# Patient Record
Sex: Female | Born: 1950
Health system: Southern US, Community
[De-identification: ages and names within clinical notes are randomized; demographics above are authoritative.]

## PROBLEM LIST (undated history)

## (undated) DIAGNOSIS — R112 Nausea with vomiting, unspecified: Secondary | ICD-10-CM

## (undated) DIAGNOSIS — T8859XA Other complications of anesthesia, initial encounter: Secondary | ICD-10-CM

## (undated) DIAGNOSIS — Z9889 Other specified postprocedural states: Secondary | ICD-10-CM

## (undated) DIAGNOSIS — E119 Type 2 diabetes mellitus without complications: Secondary | ICD-10-CM

## (undated) DIAGNOSIS — T7840XA Allergy, unspecified, initial encounter: Secondary | ICD-10-CM

## (undated) DIAGNOSIS — J302 Other seasonal allergic rhinitis: Secondary | ICD-10-CM

## (undated) DIAGNOSIS — N6320 Unspecified lump in the left breast, unspecified quadrant: Secondary | ICD-10-CM

## (undated) DIAGNOSIS — T4145XA Adverse effect of unspecified anesthetic, initial encounter: Secondary | ICD-10-CM

## (undated) HISTORY — DX: Allergy, unspecified, initial encounter: T78.40XA

## (undated) HISTORY — PX: BREAST SURGERY: SHX581

## (undated) HISTORY — PX: CHOLECYSTECTOMY: SHX55

## (undated) HISTORY — PX: KNEE ARTHROSCOPY: SUR90

## (undated) HISTORY — PX: TONSILLECTOMY: SUR1361

---

## 1999-11-15 ENCOUNTER — Encounter (INDEPENDENT_AMBULATORY_CARE_PROVIDER_SITE_OTHER): Payer: Self-pay | Admitting: Specialist

## 1999-11-15 ENCOUNTER — Ambulatory Visit (HOSPITAL_BASED_OUTPATIENT_CLINIC_OR_DEPARTMENT_OTHER): Admission: RE | Admit: 1999-11-15 | Discharge: 1999-11-15 | Payer: Self-pay | Admitting: *Deleted

## 2002-04-21 ENCOUNTER — Emergency Department (HOSPITAL_COMMUNITY): Admission: EM | Admit: 2002-04-21 | Discharge: 2002-04-21 | Payer: Self-pay

## 2008-09-22 ENCOUNTER — Emergency Department (HOSPITAL_COMMUNITY): Admission: EM | Admit: 2008-09-22 | Discharge: 2008-09-22 | Payer: Self-pay | Admitting: Emergency Medicine

## 2010-10-01 IMAGING — CR DG CERVICAL SPINE COMPLETE 4+V
6 series · 6 of 6 positions shown · non-contrast
Comparison: No priors

CLINICAL DATA: Fell/stiff neck

CERVICAL SPINE - COMPLETE 4+ VIEW

[w c-spine lat]
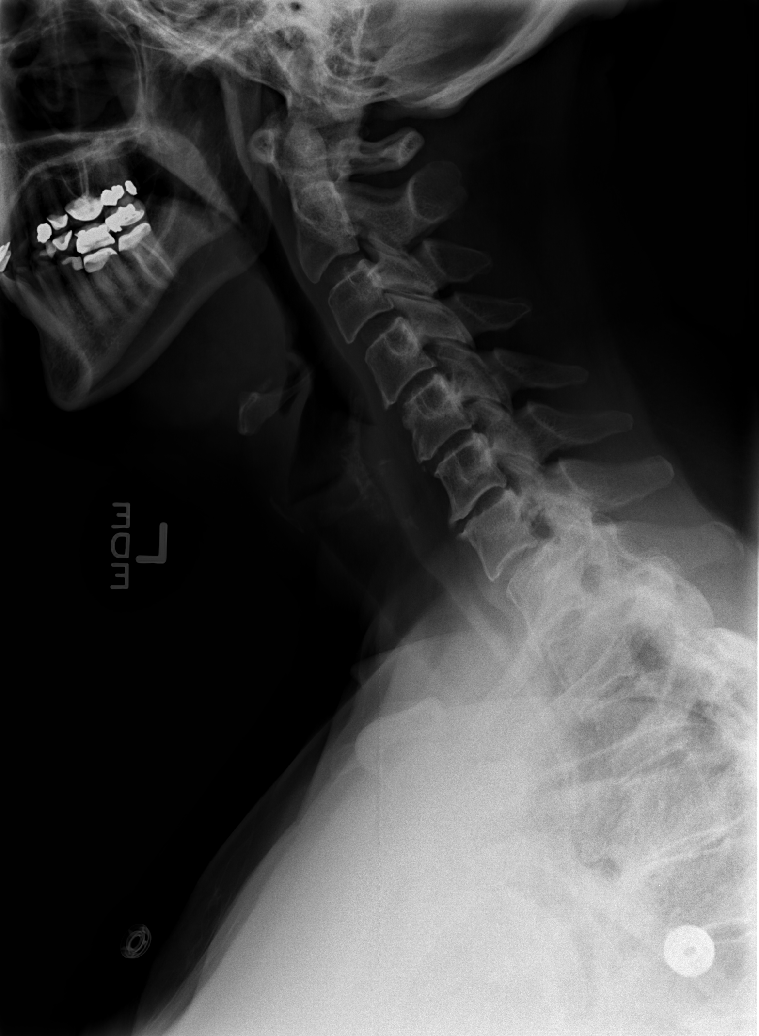

[w c-spine oblique (1 of 2)]
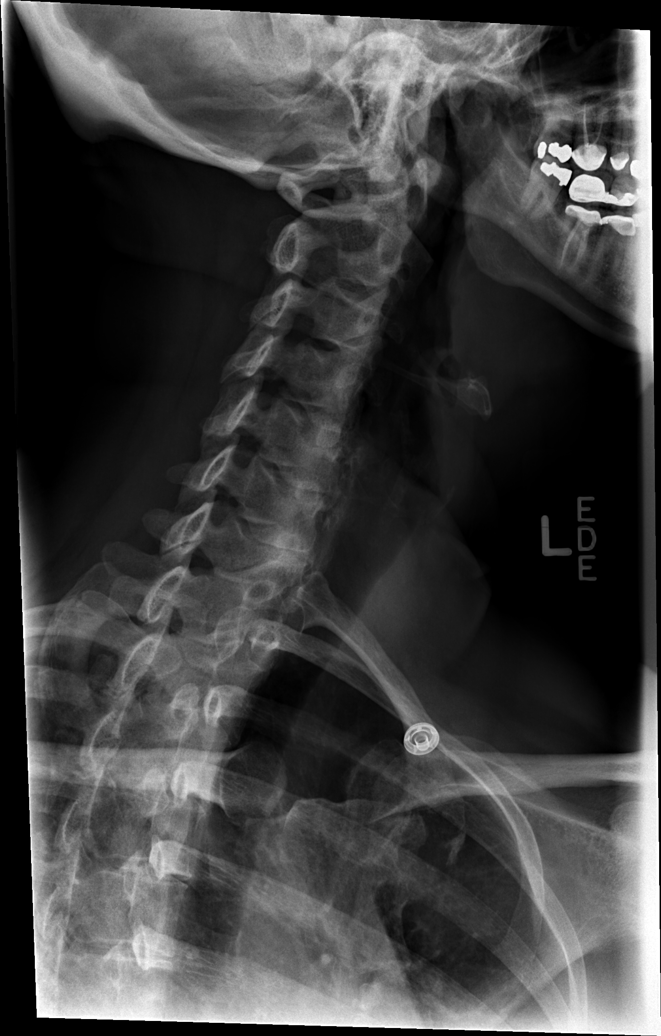

[w c-spine oblique (2 of 2)]
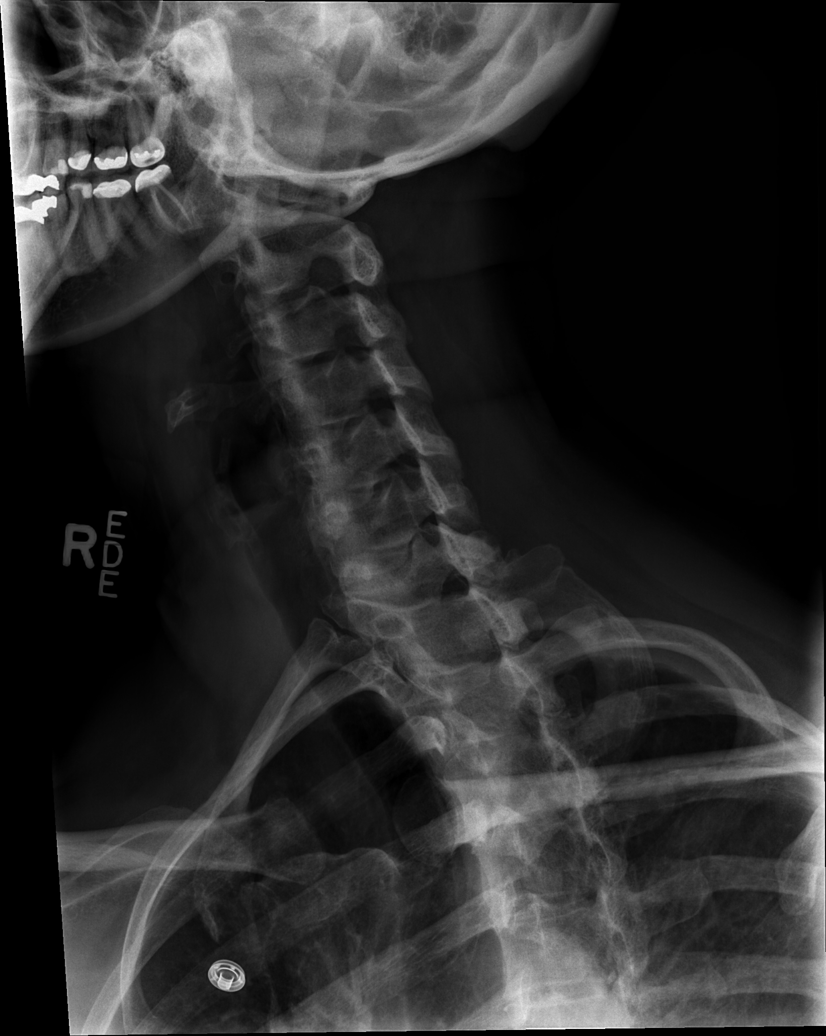

[w c-spine a.p.]
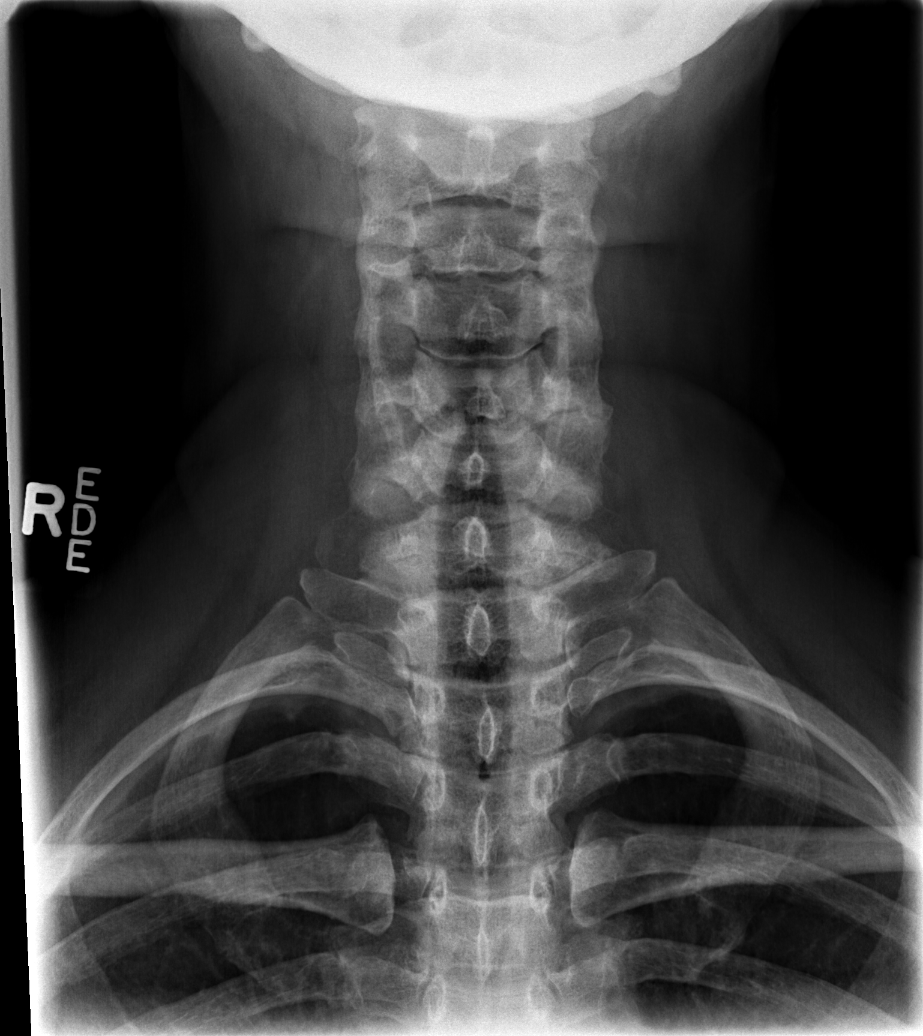

[w c-spine odontoid (1 of 2)]
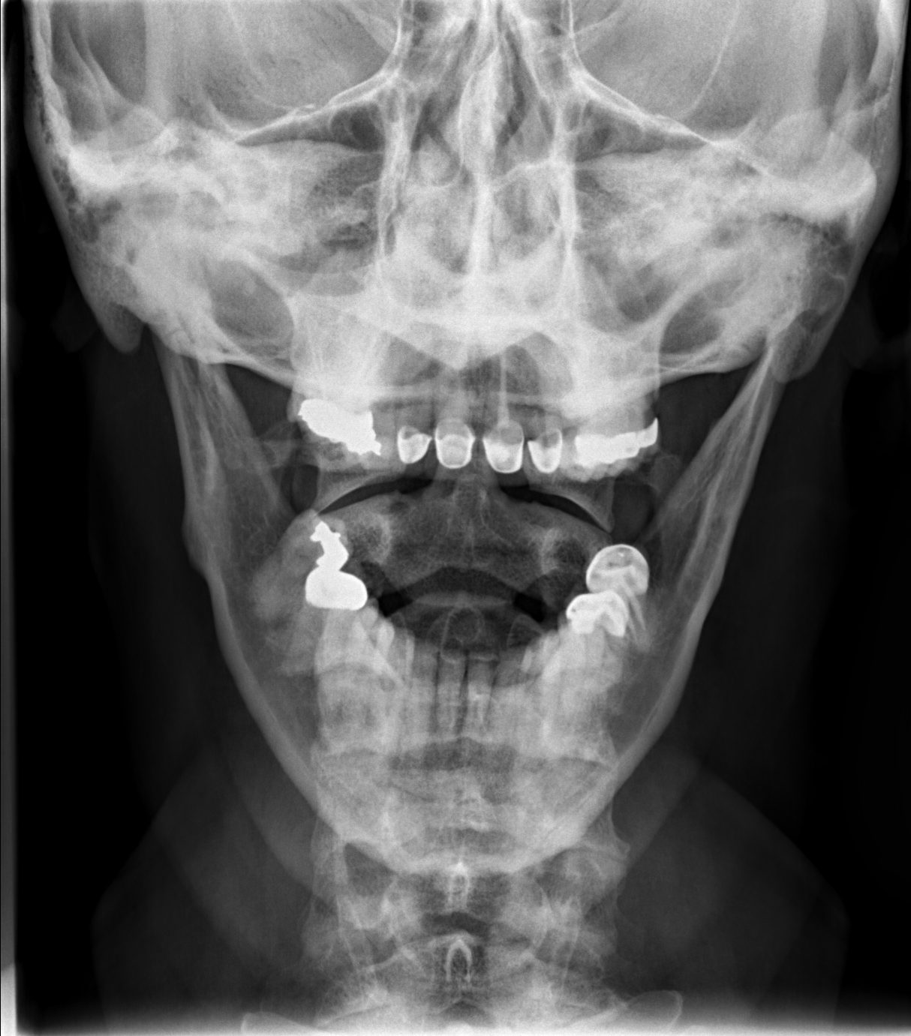

[w c-spine odontoid (2 of 2)]
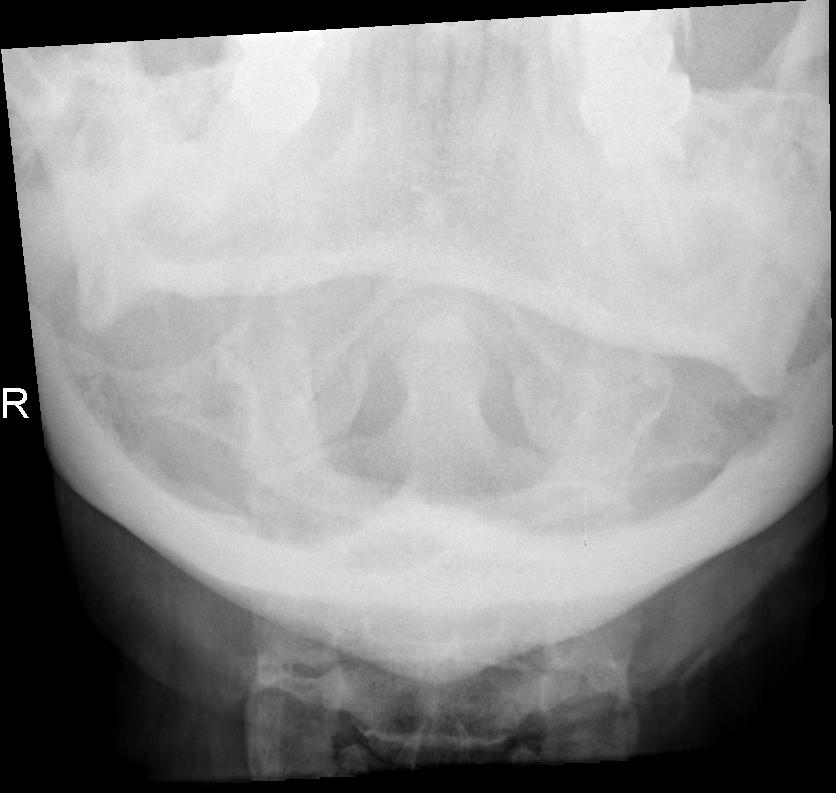

[6 of 6 positions shown; findings below may reference images not displayed]

FINDINGS: The there is loss of lordosis and degenerative disc
disease most notably at C6-7.  No fracture or subluxation.  No
foraminal narrowing.  Prevertebral soft tissues normal.
IMPRESSION: Loss of lordosis and degenerative disc disease - no acute findings.

## 2010-12-31 NOTE — Op Note (Signed)
[place]. [facility]  Patient:    Shawna Payne, Shawna Payne                       MRN: [id number] Proc. Date: 11/15/99 Adm. Date:  [id number] Attending:  Kandis Mannan CC:         Katherine Roan, M.D.             Jonelle Sports. Cheryll Cockayne, M.D.                           Operative Report  CCS NUMBER:  [id number]  PREOPERATIVE DIAGNOSIS:  Bloody nipple discharge, left breast.  POSTOPERATIVE DIAGNOSIS:  Bloody nipple discharge, left breast.  OPERATION:  Excision of ductal tissue, left breast.  SURGEON:  Maisie Fus B. Samuella Cota, M.D.  ANESTHESIA:  General.  ANESTHESIOLOGIST:  Halford Decamp, M.D. and CRNA.  DESCRIPTION OF PROCEDURE:  The patient was taken to the operating room and placed on the table in the supine position.  After satisfactory general anesthetic with LMA intubation, the left breast was prepped and draped as a sterile field.  A circumareolar skin incision was outlined with a skin marker extending from about 7 oclock to 11 oclock.  The circumareolar incision was then made and the areola was dissected free from the underlying tissue.  The patient had a large duct which as identified at about the 8-9 oclock position.  A small lacrimal probe was placed  into this, and it went for about 1 cm.  There was a firm mass effect in the area. The breast tissue in this area was then excised using mainly the cautery. After the specimen had been removed, there seemed to be a firm area at about the 7 oclock position, which was in a linear fashion.  This area was dissected, and it was noted that this was a large fat lobule in the breast.  Bleeding was controlled with the cautery.  The opening of the duct beneath the areola was marked with a  short suture of 4-0 Vicryl.  After hemostasis had been obtained, the wound was irrigated.  The subcuticular skin was closed with interrupted sutures of 4-0 Vicryl.  The circumareolar incision was then closed with a running  subcuticular  suture of 5-0 Vicryl.  Benzoin and 1/2 inch Steri-Strips were used to reinforce the skin closure.  A pressure dressing using 4 x 4, ABD and 4 inch Hypafix was applied. The patient seemed to tolerate the procedure well and was taken to the PACU in satisfactory condition. DD:  11/15/99 TD:  11/15/99 Job: [id number]

## 2012-01-26 ENCOUNTER — Other Ambulatory Visit: Payer: Self-pay | Admitting: Internal Medicine

## 2012-01-26 DIAGNOSIS — R7989 Other specified abnormal findings of blood chemistry: Secondary | ICD-10-CM

## 2012-01-30 ENCOUNTER — Ambulatory Visit
Admission: RE | Admit: 2012-01-30 | Discharge: 2012-01-30 | Disposition: A | Discharge status: Home / Self Care | Payer: BC Managed Care – PPO | Source: Ambulatory Visit | Attending: Internal Medicine | Admitting: Internal Medicine

## 2012-01-30 ENCOUNTER — Other Ambulatory Visit: Payer: Self-pay | Admitting: Internal Medicine

## 2012-01-30 DIAGNOSIS — R7989 Other specified abnormal findings of blood chemistry: Secondary | ICD-10-CM

## 2014-02-07 IMAGING — US US ABDOMEN LIMITED
1 series · 14 of 25 positions shown · non-contrast
Comparison: None.

CLINICAL DATA: Elevated LFTs

ABDOMEN ULTRASOUND LIMITED
TECHNIQUE: Right upper quadrant ultrasound

[Series 1: us abdomen limited · 0.22mm/px · 14 of 36 slices shown]
[im 1/36]
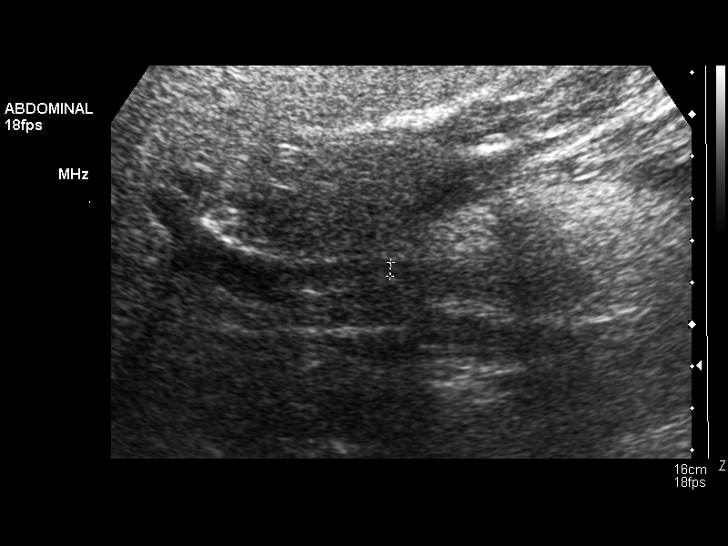
[im 3/36]
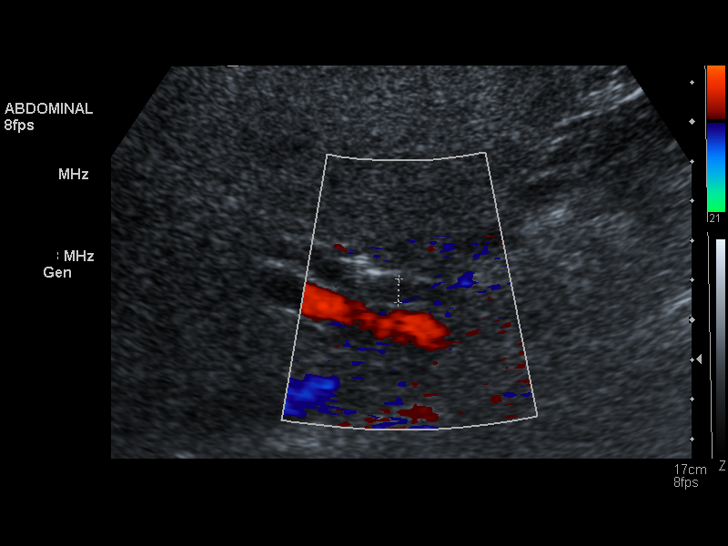
[im 6/36]
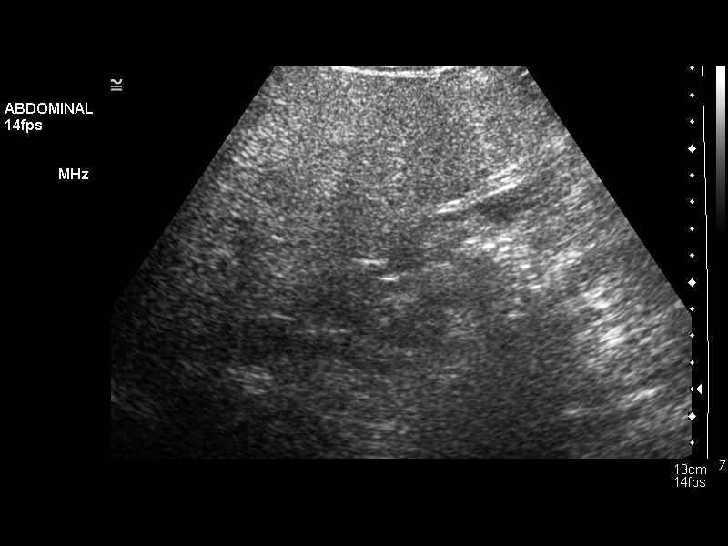
[im 9/36]
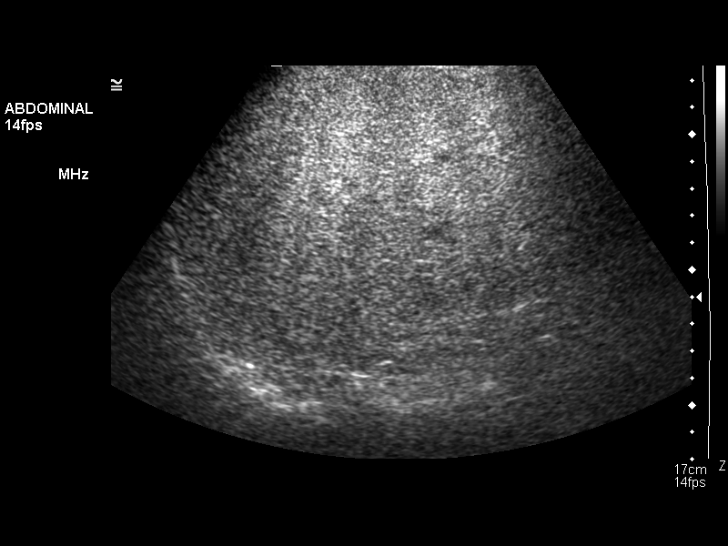
[im 12/36]
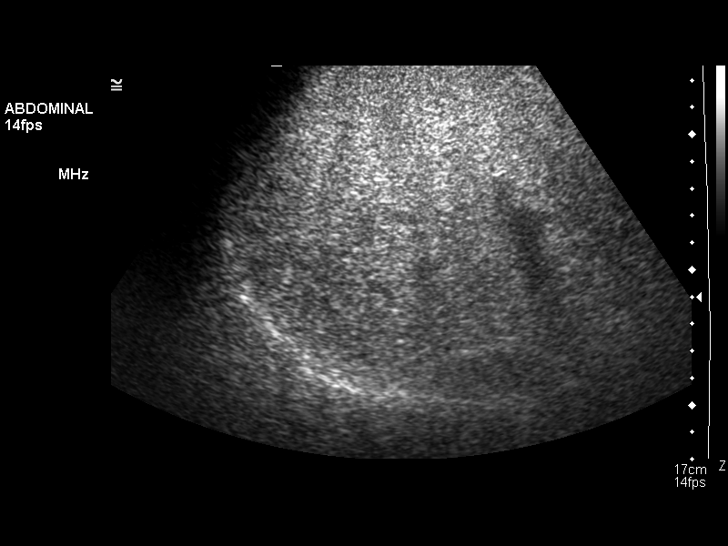
[im 14/36]
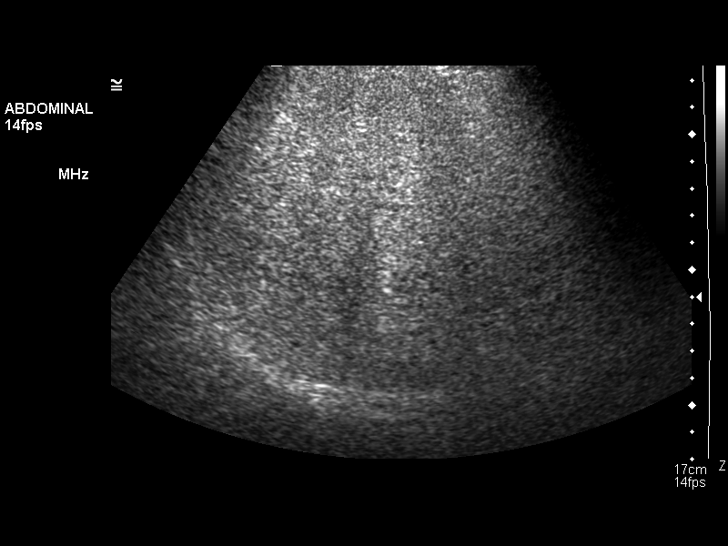
[im 17/36]
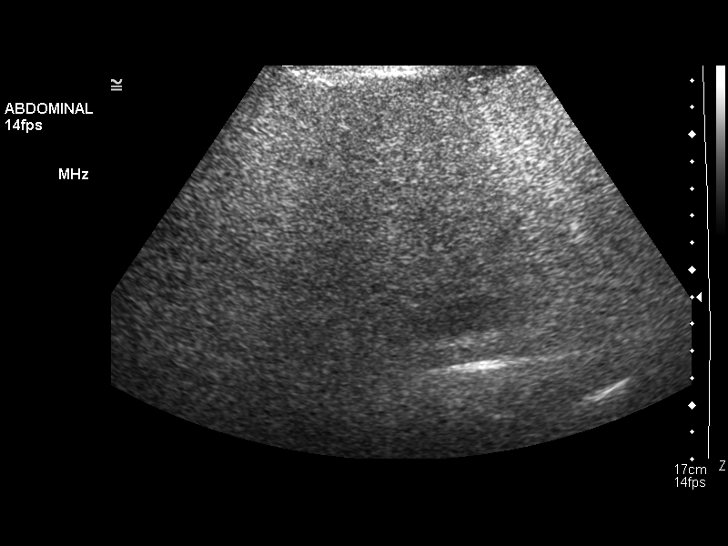
[im 19/36]
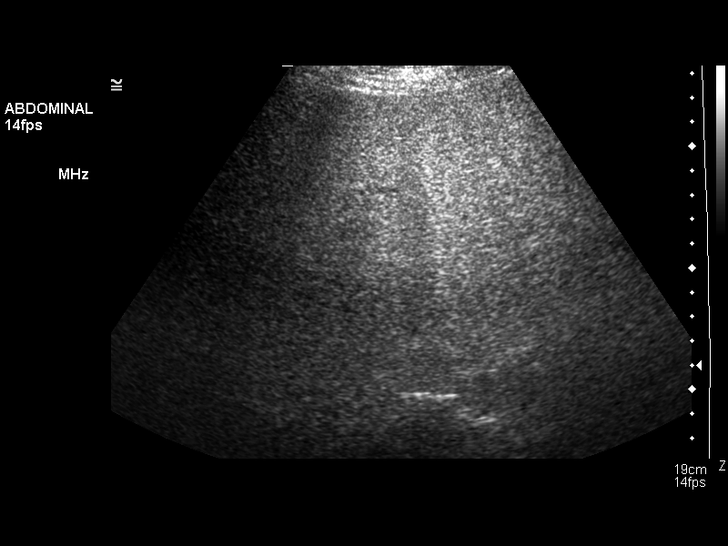
[im 22/36]
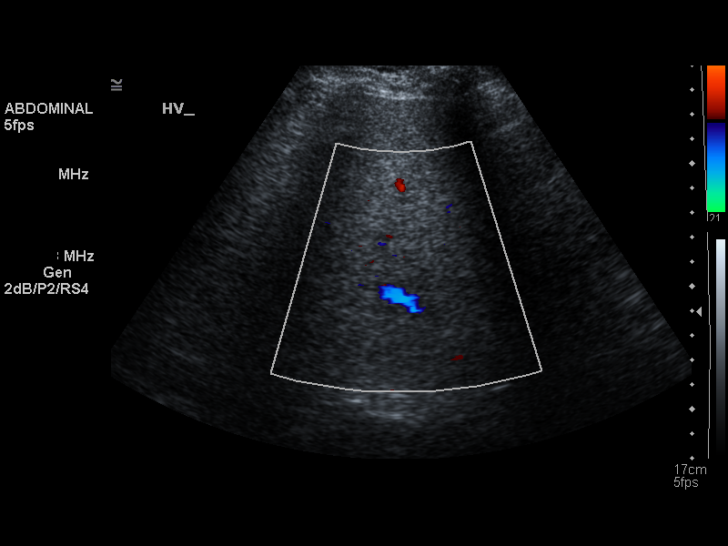
[im 24/36]
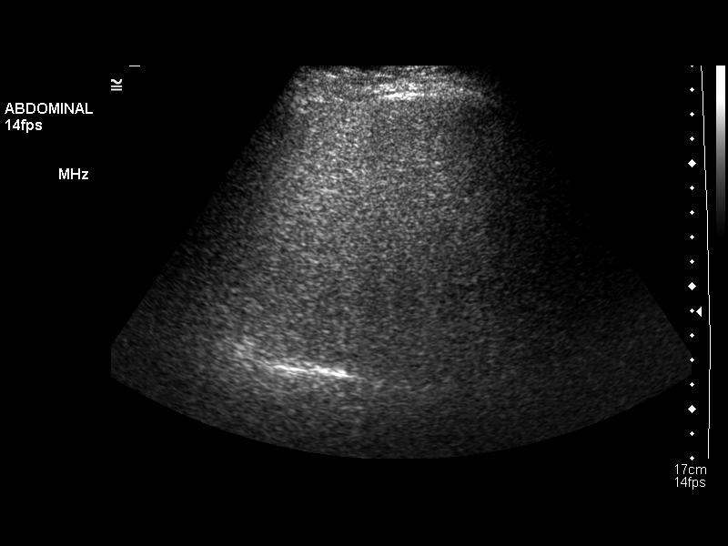
[im 27/36]
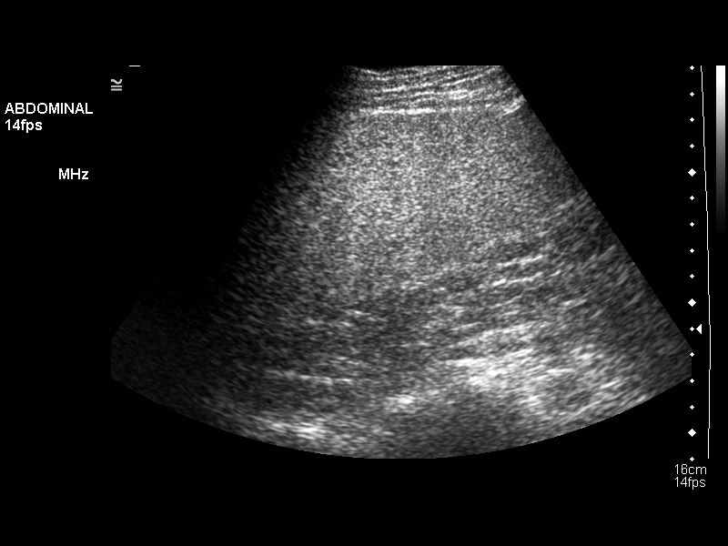
[im 30/36]
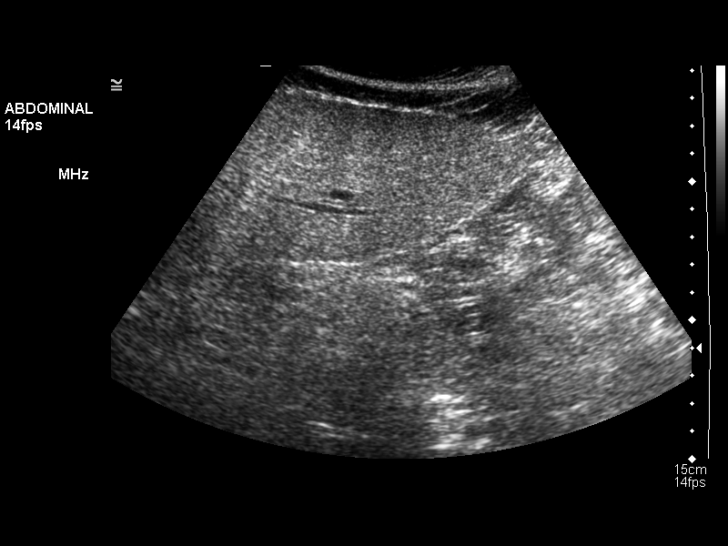
[im 33/36]
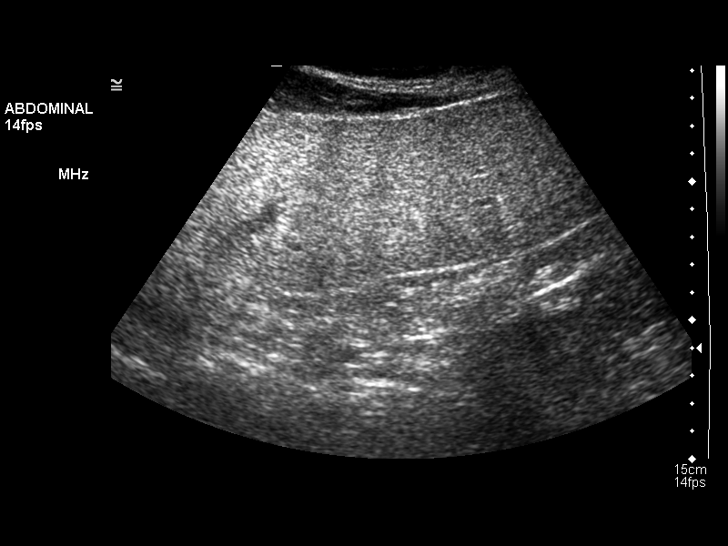
[im 36/36]
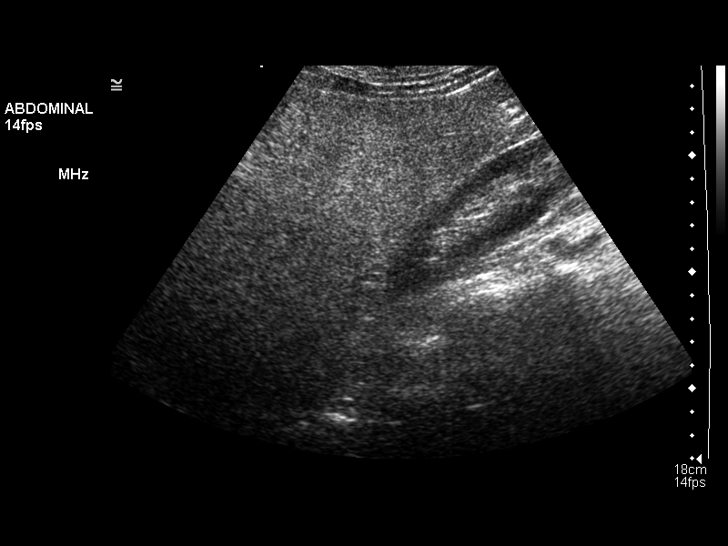

[14 of 25 positions shown; findings below may reference images not displayed]

FINDINGS: The gallbladder is surgically absent.  CBD measures
mm proximally.  Distal CBD measures 3.3 mm in diameter.

Liver shows diffuse increased echogenicity suspicious for fatty
infiltration.  No focal hepatic mass.  No intrahepatic biliary
ductal dilatation.
IMPRESSION: 1.  Surgically absent gallbladder.  CBD measures 6.6 mm proximally.
Distal CBD measures 3.3 mm in diameter.
2.  Diffuse increased echogenicity of the liver suspicious for
fatty infiltration.  No intrahepatic biliary ductal dilatation.  No
focal hepatic mass.

## 2016-05-04 ENCOUNTER — Other Ambulatory Visit: Payer: Self-pay | Admitting: Radiology

## 2016-05-27 ENCOUNTER — Ambulatory Visit: Payer: Self-pay | Admitting: General Surgery

## 2016-05-27 DIAGNOSIS — N6022 Fibroadenosis of left breast: Secondary | ICD-10-CM

## 2016-06-17 ENCOUNTER — Encounter (HOSPITAL_BASED_OUTPATIENT_CLINIC_OR_DEPARTMENT_OTHER): Payer: Self-pay | Admitting: *Deleted

## 2016-06-23 ENCOUNTER — Encounter (HOSPITAL_BASED_OUTPATIENT_CLINIC_OR_DEPARTMENT_OTHER)
Admission: RE | Admit: 2016-06-23 | Discharge: 2016-06-23 | Disposition: A | Discharge status: Home / Self Care | Payer: BLUE CROSS/BLUE SHIELD | Source: Ambulatory Visit | Attending: General Surgery | Admitting: General Surgery

## 2016-06-23 DIAGNOSIS — Z01812 Encounter for preprocedural laboratory examination: Secondary | ICD-10-CM | POA: Insufficient documentation

## 2016-06-23 DIAGNOSIS — Z01810 Encounter for preprocedural cardiovascular examination: Secondary | ICD-10-CM | POA: Diagnosis not present

## 2016-06-23 LAB — BASIC METABOLIC PANEL
ANION GAP: 12 (ref 5–15)
BUN: 9 mg/dL (ref 6–20)
CHLORIDE: 100 mmol/L — AB (ref 101–111)
CO2: 27 mmol/L (ref 22–32)
Calcium: 9.6 mg/dL (ref 8.9–10.3)
Creatinine, Ser: 0.8 mg/dL (ref 0.44–1.00)
GFR calc Af Amer: >60 mL/min (ref >60)
GFR calc non Af Amer: >60 mL/min (ref >60)
GLUCOSE: 214 mg/dL — AB (ref 65–99)
POTASSIUM: 3.7 mmol/L (ref 3.5–5.1)
Sodium: 139 mmol/L (ref 135–145)

## 2016-06-23 NOTE — Pre-Procedure Instructions (Signed)
8 oz. water given to pt. with instructions to drink by 0900 DOS

## 2016-06-30 ENCOUNTER — Encounter (HOSPITAL_BASED_OUTPATIENT_CLINIC_OR_DEPARTMENT_OTHER)
Admission: RE | Disposition: A | Discharge status: Home / Self Care | Payer: Self-pay | Source: Ambulatory Visit | Attending: General Surgery

## 2016-06-30 ENCOUNTER — Ambulatory Visit (HOSPITAL_BASED_OUTPATIENT_CLINIC_OR_DEPARTMENT_OTHER): Payer: BLUE CROSS/BLUE SHIELD | Admitting: Anesthesiology

## 2016-06-30 ENCOUNTER — Ambulatory Visit (HOSPITAL_BASED_OUTPATIENT_CLINIC_OR_DEPARTMENT_OTHER)
Admission: RE | Admit: 2016-06-30 | Discharge: 2016-06-30 | Disposition: A | Discharge status: Home / Self Care | Payer: BLUE CROSS/BLUE SHIELD | Source: Ambulatory Visit | Attending: General Surgery | Admitting: General Surgery

## 2016-06-30 ENCOUNTER — Encounter (HOSPITAL_BASED_OUTPATIENT_CLINIC_OR_DEPARTMENT_OTHER): Payer: Self-pay | Admitting: *Deleted

## 2016-06-30 DIAGNOSIS — N6022 Fibroadenosis of left breast: Secondary | ICD-10-CM | POA: Insufficient documentation

## 2016-06-30 DIAGNOSIS — E119 Type 2 diabetes mellitus without complications: Secondary | ICD-10-CM | POA: Insufficient documentation

## 2016-06-30 DIAGNOSIS — Z87891 Personal history of nicotine dependence: Secondary | ICD-10-CM | POA: Insufficient documentation

## 2016-06-30 HISTORY — DX: Unspecified lump in the left breast, unspecified quadrant: N63.20

## 2016-06-30 HISTORY — DX: Other complications of anesthesia, initial encounter: T88.59XA

## 2016-06-30 HISTORY — DX: Nausea with vomiting, unspecified: Z98.890

## 2016-06-30 HISTORY — DX: Adverse effect of unspecified anesthetic, initial encounter: T41.45XA

## 2016-06-30 HISTORY — DX: Other seasonal allergic rhinitis: J30.2

## 2016-06-30 HISTORY — DX: Nausea with vomiting, unspecified: R11.2

## 2016-06-30 HISTORY — PX: BREAST LUMPECTOMY WITH RADIOACTIVE SEED LOCALIZATION: SHX6424

## 2016-06-30 HISTORY — DX: Type 2 diabetes mellitus without complications: E11.9

## 2016-06-30 LAB — GLUCOSE, CAPILLARY: Glucose-Capillary: 140 mg/dL — ABNORMAL HIGH (ref 65–99)

## 2016-06-30 SURGERY — BREAST LUMPECTOMY WITH RADIOACTIVE SEED LOCALIZATION
Anesthesia: General | Site: Breast | Laterality: Left

## 2016-06-30 MED ORDER — MIDAZOLAM HCL 2 MG/2ML IJ SOLN
INTRAMUSCULAR | Status: AC
  Filled 2016-06-30: qty 2

## 2016-06-30 MED ORDER — BUPIVACAINE HCL (PF) 0.25 % IJ SOLN
INTRAMUSCULAR | Status: DC | PRN
  Administered 2016-06-30: 20 mL

## 2016-06-30 MED ORDER — LIDOCAINE 2% (20 MG/ML) 5 ML SYRINGE
INTRAMUSCULAR | Status: DC | PRN
  Administered 2016-06-30: 60 mg via INTRAVENOUS

## 2016-06-30 MED ORDER — CHLORHEXIDINE GLUCONATE CLOTH 2 % EX PADS: 6.0000 | MEDICATED_PAD | Freq: Once | CUTANEOUS | Status: DC

## 2016-06-30 MED ORDER — CEFAZOLIN SODIUM-DEXTROSE 2-4 GM/100ML-% IV SOLN
INTRAVENOUS | Status: AC
  Filled 2016-06-30: qty 100

## 2016-06-30 MED ORDER — FENTANYL CITRATE (PF) 100 MCG/2ML IJ SOLN
INTRAMUSCULAR | Status: AC
  Filled 2016-06-30: qty 2

## 2016-06-30 MED ORDER — PHENYLEPHRINE 40 MCG/ML (10ML) SYRINGE FOR IV PUSH (FOR BLOOD PRESSURE SUPPORT)
PREFILLED_SYRINGE | INTRAVENOUS | Status: DC | PRN
Start: 2016-06-30 — End: 2016-06-30
  Administered 2016-06-30 (×2): 80 ug via INTRAVENOUS

## 2016-06-30 MED ORDER — OXYCODONE HCL 5 MG/5ML PO SOLN: 5.0000 mg | Freq: Once | ORAL | Status: DC | PRN

## 2016-06-30 MED ORDER — MIDAZOLAM HCL 2 MG/2ML IJ SOLN
1.0000 mg | INTRAMUSCULAR | Status: DC | PRN
  Administered 2016-06-30: 2 mg via INTRAVENOUS

## 2016-06-30 MED ORDER — MEPERIDINE HCL 25 MG/ML IJ SOLN: 6.2500 mg | INTRAMUSCULAR | Status: DC | PRN

## 2016-06-30 MED ORDER — DEXAMETHASONE SODIUM PHOSPHATE 10 MG/ML IJ SOLN
INTRAMUSCULAR | Status: AC
  Filled 2016-06-30: qty 1

## 2016-06-30 MED ORDER — OXYCODONE HCL 5 MG PO TABS: 5.0000 mg | ORAL_TABLET | Freq: Once | ORAL | Status: DC | PRN

## 2016-06-30 MED ORDER — FENTANYL CITRATE (PF) 100 MCG/2ML IJ SOLN
50.0000 ug | INTRAMUSCULAR | Status: DC | PRN
  Administered 2016-06-30: 100 ug via INTRAVENOUS

## 2016-06-30 MED ORDER — ONDANSETRON HCL 4 MG/2ML IJ SOLN
INTRAMUSCULAR | Status: AC
  Filled 2016-06-30: qty 2

## 2016-06-30 MED ORDER — SCOPOLAMINE 1 MG/3DAYS TD PT72
MEDICATED_PATCH | TRANSDERMAL | Status: AC
  Filled 2016-06-30: qty 1

## 2016-06-30 MED ORDER — PROPOFOL 10 MG/ML IV BOLUS
INTRAVENOUS | Status: DC | PRN
  Administered 2016-06-30: 50 mg via INTRAVENOUS
  Administered 2016-06-30: 150 mg via INTRAVENOUS

## 2016-06-30 MED ORDER — ONDANSETRON HCL 4 MG/2ML IJ SOLN
INTRAMUSCULAR | Status: DC | PRN
  Administered 2016-06-30: 4 mg via INTRAVENOUS

## 2016-06-30 MED ORDER — LIDOCAINE 2% (20 MG/ML) 5 ML SYRINGE
INTRAMUSCULAR | Status: AC
  Filled 2016-06-30: qty 5

## 2016-06-30 MED ORDER — PROMETHAZINE HCL 25 MG/ML IJ SOLN: 6.2500 mg | INTRAMUSCULAR | Status: DC | PRN

## 2016-06-30 MED ORDER — LACTATED RINGERS IV SOLN: INTRAVENOUS | Status: DC

## 2016-06-30 MED ORDER — LACTATED RINGERS IV SOLN
INTRAVENOUS | Status: DC
  Administered 2016-06-30 (×3): via INTRAVENOUS

## 2016-06-30 MED ORDER — PROPOFOL 10 MG/ML IV BOLUS
INTRAVENOUS | Status: AC
  Filled 2016-06-30: qty 20

## 2016-06-30 MED ORDER — SCOPOLAMINE 1 MG/3DAYS TD PT72
1.0000 | MEDICATED_PATCH | Freq: Once | TRANSDERMAL | Status: AC | PRN
  Administered 2016-06-30: 1 via TRANSDERMAL

## 2016-06-30 MED ORDER — HYDROCODONE-ACETAMINOPHEN 5-325 MG PO TABS: 1.0000 | ORAL_TABLET | ORAL | 0 refills | Status: DC | PRN

## 2016-06-30 MED ORDER — HYDROMORPHONE HCL 1 MG/ML IJ SOLN: 0.2500 mg | INTRAMUSCULAR | Status: DC | PRN

## 2016-06-30 MED ORDER — CEFAZOLIN SODIUM-DEXTROSE 2-4 GM/100ML-% IV SOLN
2.0000 g | INTRAVENOUS | Status: AC
  Administered 2016-06-30: 2 g via INTRAVENOUS

## 2016-06-30 MED ORDER — DEXAMETHASONE SODIUM PHOSPHATE 4 MG/ML IJ SOLN
INTRAMUSCULAR | Status: DC | PRN
  Administered 2016-06-30: 10 mg via INTRAVENOUS

## 2016-06-30 SURGICAL SUPPLY — 45 items
ADH SKN CLS APL DERMABOND .7 (GAUZE/BANDAGES/DRESSINGS) ×1
APPLIER CLIP 9.375 MED OPEN (MISCELLANEOUS) ×2
APR CLP MED 9.3 20 MLT OPN (MISCELLANEOUS) ×1
BLADE SURG 15 STRL LF DISP TIS (BLADE) ×1 IMPLANT
BLADE SURG 15 STRL SS (BLADE) ×2
CANISTER SUC SOCK COL 7IN (MISCELLANEOUS) ×1 IMPLANT
CANISTER SUCT 1200ML W/VALVE (MISCELLANEOUS) ×1 IMPLANT
CHLORAPREP W/TINT 26ML (MISCELLANEOUS) ×2 IMPLANT
CLIP APPLIE 9.375 MED OPEN (MISCELLANEOUS) IMPLANT
COVER BACK TABLE 60X90IN (DRAPES) ×2 IMPLANT
COVER MAYO STAND STRL (DRAPES) ×2 IMPLANT
COVER PROBE W GEL 5X96 (DRAPES) ×2 IMPLANT
DECANTER SPIKE VIAL GLASS SM (MISCELLANEOUS) IMPLANT
DERMABOND ADVANCED (GAUZE/BANDAGES/DRESSINGS) ×1
DERMABOND ADVANCED .7 DNX12 (GAUZE/BANDAGES/DRESSINGS) ×1 IMPLANT
DEVICE DUBIN W/COMP PLATE 8390 (MISCELLANEOUS) ×2 IMPLANT
DRAPE LAPAROSCOPIC ABDOMINAL (DRAPES) ×1 IMPLANT
DRAPE UTILITY XL STRL (DRAPES) ×2 IMPLANT
ELECT COATED BLADE 2.86 ST (ELECTRODE) ×2 IMPLANT
ELECT REM PT RETURN 9FT ADLT (ELECTROSURGICAL) ×2
ELECTRODE REM PT RTRN 9FT ADLT (ELECTROSURGICAL) ×1 IMPLANT
GLOVE BIO SURGEON STRL SZ 6.5 (GLOVE) ×2 IMPLANT
GLOVE BIO SURGEON STRL SZ7.5 (GLOVE) ×4 IMPLANT
GLOVE BIOGEL PI IND STRL 7.0 (GLOVE) IMPLANT
GLOVE BIOGEL PI INDICATOR 7.0 (GLOVE) ×2
GOWN STRL REUS W/ TWL LRG LVL3 (GOWN DISPOSABLE) ×2 IMPLANT
GOWN STRL REUS W/TWL LRG LVL3 (GOWN DISPOSABLE) ×6
ILLUMINATOR WAVEGUIDE N/F (MISCELLANEOUS) IMPLANT
KIT MARKER MARGIN INK (KITS) ×2 IMPLANT
LIGHT WAVEGUIDE WIDE FLAT (MISCELLANEOUS) IMPLANT
NDL HYPO 25X1 1.5 SAFETY (NEEDLE) IMPLANT
NEEDLE HYPO 25X1 1.5 SAFETY (NEEDLE) ×2 IMPLANT
NS IRRIG 1000ML POUR BTL (IV SOLUTION) ×1 IMPLANT
PACK BASIN DAY SURGERY FS (CUSTOM PROCEDURE TRAY) ×2 IMPLANT
PENCIL BUTTON HOLSTER BLD 10FT (ELECTRODE) ×2 IMPLANT
SLEEVE SCD COMPRESS KNEE MED (MISCELLANEOUS) ×2 IMPLANT
SPONGE LAP 18X18 X RAY DECT (DISPOSABLE) ×2 IMPLANT
SUT MON AB 4-0 PC3 18 (SUTURE) IMPLANT
SUT SILK 2 0 SH (SUTURE) IMPLANT
SUT VICRYL 3-0 CR8 SH (SUTURE) ×2 IMPLANT
SYR CONTROL 10ML LL (SYRINGE) ×1 IMPLANT
TOWEL OR 17X24 6PK STRL BLUE (TOWEL DISPOSABLE) ×2 IMPLANT
TOWEL OR NON WOVEN STRL DISP B (DISPOSABLE) ×1 IMPLANT
TUBE CONNECTING 20X1/4 (TUBING) ×1 IMPLANT
YANKAUER SUCT BULB TIP NO VENT (SUCTIONS) IMPLANT

## 2016-06-30 NOTE — Discharge Instructions (Signed)

## 2016-06-30 NOTE — Anesthesia Postprocedure Evaluation (Signed)
Anesthesia Post Note  Patient: Shawna Payne  Procedure(s) Performed: Procedure(s) (LRB): LEFT BREAST LUMPECTOMY WITH RADIOACTIVE SEED LOCALIZATION (Left)  Patient location during evaluation: PACU Anesthesia Type: General Level of consciousness: awake and alert Pain management: pain level controlled Vital Signs Assessment: post-procedure vital signs reviewed and stable Respiratory status: spontaneous breathing, nonlabored ventilation, respiratory function stable and patient connected to nasal cannula oxygen Cardiovascular status: blood pressure returned to baseline and stable Postop Assessment: no signs of nausea or vomiting Anesthetic complications: no    Last Vitals:  Vitals:   06/30/16 1430 06/30/16 1445  BP: (!) 146/69 (!) 142/65  Pulse: 82 78  Resp: 13 12  Temp:      Last Pain:  Vitals:   06/30/16 1445  TempSrc:   PainSc: 0-No pain                 Effie Berkshire

## 2016-06-30 NOTE — H&P (Signed)
Shawna Payne  Location: [facility] Surgery Patient #: [id number] DOB: 1951-05-13 Married / Language: Undefined / Race: White Female   History of Present Illness  The patient is a 64 year old female who presents with a breast mass. We are asked to see the patient in consultation by Dr. Elige Radon to evaluate her for a complex sclerosing lesion of the left breast. The patient is a 65 year old white female who has been getting 6 month follow-ups for a cyst in the lower portion of the left breast. On her recent mammogram an area of distortion was seen in the lower outer left breast. This was biopsied and came back as a complex sclerosing lesion. She denies any breast pain or discharge from the nipple. She does have a history of a benign papilloma removed from the left breast about 20 years ago.   Other Problems  Cholelithiasis Diabetes Mellitus Heart murmur Migraine Headache  Past Surgical History  Appendectomy Breast Biopsy Left. Gallbladder Surgery - Open Knee Surgery Left. Tonsillectomy  Diagnostic Studies History  Colonoscopy never Mammogram within last year Pap Smear >5 years ago  Allergies  Sulfa Antibiotics PenicillAMINE *MISCELLANEOUS THERAPEUTIC CLASSES* Erythromycin Estolate *CHEMICALS*  Medication History  MetFORMIN HCl (1000MG  Tablet, Oral) Active.  Social History  Caffeine use Tea. No alcohol use No drug use Tobacco use Former smoker.  Family History  Diabetes Mellitus Mother. Heart Disease Mother. Hypertension Mother. Migraine Headache Mother. Thyroid problems Mother.  Pregnancy / Birth History  Age at menarche 53 years. Age of menopause 69-55 Gravida 2 Length (months) of breastfeeding 3-6 Maternal age 2-25 Para 2    Review of Systems  General Not Present- Appetite Loss, Chills, Fatigue, Fever, Night Sweats, Weight Gain and Weight Loss. Skin Not Present- Change in Wart/Mole, Dryness, Hives, Jaundice,  New Lesions, Non-Healing Wounds, Rash and Ulcer. HEENT Present- Seasonal Allergies and Wears glasses/contact lenses. Not Present- Earache, Hearing Loss, Hoarseness, Nose Bleed, Oral Ulcers, Ringing in the Ears, Sinus Pain, Sore Throat, Visual Disturbances and Yellow Eyes. Respiratory Not Present- Bloody sputum, Chronic Cough, Difficulty Breathing, Snoring and Wheezing. Breast Present- Breast Mass. Not Present- Breast Pain, Nipple Discharge and Skin Changes. Cardiovascular Not Present- Chest Pain, Difficulty Breathing Lying Down, Leg Cramps, Palpitations, Rapid Heart Rate, Shortness of Breath and Swelling of Extremities. Gastrointestinal Not Present- Abdominal Pain, Bloating, Bloody Stool, Change in Bowel Habits, Chronic diarrhea, Constipation, Difficulty Swallowing, Excessive gas, Gets full quickly at meals, Hemorrhoids, Indigestion, Nausea, Rectal Pain and Vomiting. Female Genitourinary Not Present- Frequency, Nocturia, Painful Urination, Pelvic Pain and Urgency. Musculoskeletal Not Present- Back Pain, Joint Pain, Joint Stiffness, Muscle Pain, Muscle Weakness and Swelling of Extremities. Neurological Not Present- Decreased Memory, Fainting, Headaches, Numbness, Seizures, Tingling, Tremor, Trouble walking and Weakness. Psychiatric Not Present- Anxiety, Bipolar, Change in Sleep Pattern, Depression, Fearful and Frequent crying. Endocrine Not Present- Cold Intolerance, Excessive Hunger, Hair Changes, Heat Intolerance, Hot flashes and New Diabetes. Hematology Not Present- Blood Thinners, Easy Bruising, Excessive bleeding, Gland problems, HIV and Persistent Infections.  Vitals  Weight: 176 lb Height: 67.5in Body Surface Area: 1.93 m Body Mass Index: 27.16 kg/m  Temp.: 37F(Temporal)  Pulse: 76 (Regular)  BP: 124/80 (Sitting, Left Arm, Standard)       Physical Exam  General Mental Status-Alert. General Appearance-Consistent with stated age. Hydration-Well  hydrated. Voice-Normal.  Head and Neck Head-normocephalic, atraumatic with no lesions or palpable masses. Trachea-midline. Thyroid Gland Characteristics - normal size and consistency.  Eye Eyeball - Bilateral-Extraocular movements intact. Sclera/Conjunctiva - Bilateral-No scleral icterus.  Chest and Lung Exam Chest and lung exam reveals -quiet, even and easy respiratory effort with no use of accessory muscles and on auscultation, normal breath sounds, no adventitious sounds and normal vocal resonance. Inspection Chest Wall - Normal. Back - normal.  Breast Note: There is no palpable mass in either breast. There is no palpable axillary, supraclavicular, or cervical lymphadenopathy.   Cardiovascular Cardiovascular examination reveals -normal heart sounds, regular rate and rhythm with no murmurs and normal pedal pulses bilaterally.  Abdomen Inspection Inspection of the abdomen reveals - No Hernias. Skin - Scar - no surgical scars. Palpation/Percussion Palpation and Percussion of the abdomen reveal - Soft, Non Tender, No Rebound tenderness, No Rigidity (guarding) and No hepatosplenomegaly. Auscultation Auscultation of the abdomen reveals - Bowel sounds normal.  Neurologic Neurologic evaluation reveals -alert and oriented x 3 with no impairment of recent or remote memory. Mental Status-Normal.  Musculoskeletal Normal Exam - Left-Upper Extremity Strength Normal and Lower Extremity Strength Normal. Normal Exam - Right-Upper Extremity Strength Normal and Lower Extremity Strength Normal.  Lymphatic Head & Neck  General Head & Neck Lymphatics: Bilateral - Description - Normal. Axillary  General Axillary Region: Bilateral - Description - Normal. Tenderness - Non Tender. Femoral & Inguinal  Generalized Femoral & Inguinal Lymphatics: Bilateral - Description - Normal. Tenderness - Non Tender.    Assessment & Plan  SCLEROSING ADENOSIS OF BREAST, LEFT  (N60.22) Impression: The patient appears to have a complex sclerosing lesion in the lower outer left breast. Because of its abnormal appearance and because it is considered a high risk lesion I would recommend that this area be removed. She would also like to have this done. I have discussed with her in detail the risks and benefits of the operation to remove this area as well as some of the technical aspects and she understands and wishes to proceed. I will plan for a left breast radioactive seed localized lumpectomy. Current Plans Pt Education - Breast Diseases: discussed with patient and provided information.

## 2016-06-30 NOTE — Anesthesia Preprocedure Evaluation (Signed)
Anesthesia Evaluation  Patient identified by MRN, date of birth, ID band Patient awake    Reviewed: Allergy & Precautions, NPO status , Patient's Chart, lab work & pertinent test results  History of Anesthesia Complications (+) PONV and history of anesthetic complications  Airway Mallampati: III  TM Distance: >3 FB   Mouth opening: Limited Mouth Opening  Dental  (+) Teeth Intact, Dental Advisory Given   Pulmonary former smoker,    breath sounds clear to auscultation       Cardiovascular negative cardio ROS   Rhythm:Regular Rate:Normal     Neuro/Psych negative neurological ROS  negative psych ROS   GI/Hepatic negative GI ROS, Neg liver ROS,   Endo/Other  diabetes, Type 2, Oral Hypoglycemic Agents  Renal/GU negative Renal ROS  negative genitourinary   Musculoskeletal negative musculoskeletal ROS (+)   Abdominal   Peds negative pediatric ROS (+)  Hematology negative hematology ROS (+)   Anesthesia Other Findings   Reproductive/Obstetrics negative OB ROS                             Anesthesia Physical Anesthesia Plan  ASA: II  Anesthesia Plan: General   Post-op Pain Management:    Induction: Intravenous  Airway Management Planned: LMA  Additional Equipment:   Intra-op Plan:   Post-operative Plan: Extubation in OR  Informed Consent: I have reviewed the patients History and Physical, chart, labs and discussed the procedure including the risks, benefits and alternatives for the proposed anesthesia with the patient or authorized representative who has indicated his/her understanding and acceptance.   Dental advisory given  Plan Discussed with: CRNA  Anesthesia Plan Comments:         Anesthesia Quick Evaluation

## 2016-06-30 NOTE — Anesthesia Procedure Notes (Signed)
Procedure Name: LMA Insertion Date/Time: 06/30/2016 12:54 PM Performed by: Lyndee Leo Pre-anesthesia Checklist: Patient identified, Emergency Drugs available, Suction available and Patient being monitored Patient Re-evaluated:Patient Re-evaluated prior to inductionOxygen Delivery Method: Circle system utilized Preoxygenation: Pre-oxygenation with 100% oxygen Intubation Type: IV induction Ventilation: Mask ventilation without difficulty LMA: LMA inserted LMA Size: 3.0 Number of attempts: 1 Airway Equipment and Method: Bite block Placement Confirmation: positive ETCO2 Tube secured with: Tape Dental Injury: Teeth and Oropharynx as per pre-operative assessment

## 2016-06-30 NOTE — Op Note (Signed)
06/30/2016  1:37 PM  PATIENT:  Shawna Payne  65 y.o. female  PRE-OPERATIVE DIAGNOSIS:  LEFT BREAST COMPLEX SCLEROSING LESION  POST-OPERATIVE DIAGNOSIS:  LEFT BREAST COMPLEX SCLEROSING LESION  PROCEDURE:  Procedure(s): LEFT BREAST LUMPECTOMY WITH RADIOACTIVE SEED LOCALIZATION (Left)  SURGEON:  Surgeon(s) and Role:    * Jovita Kussmaul, MD - Primary  PHYSICIAN ASSISTANT:   ASSISTANTS: none   ANESTHESIA:   local and general  EBL:  Total I/O In: 200 [I.V.:200] Out: -   BLOOD ADMINISTERED:none  DRAINS: none   LOCAL MEDICATIONS USED:  MARCAINE     SPECIMEN:  Source of Specimen:  left breast tissue  DISPOSITION OF SPECIMEN:  PATHOLOGY  COUNTS:  YES  TOURNIQUET:  * No tourniquets in log *  DICTATION: .Dragon Dictation   After informed consent was obtained, the patient was brought to the operating room and placed in the supine position on the operating room table. After adequate induction of anesthesia, the patient's left breast was prepped with chloroprep, allowed to dry, and draped in the usual sterile manner. An appropriate timeout was performed. Previously an I125 seed was placed in the lower portion of the left breast to mark an area of a complex sclerosing lesion. The neoprobe was set to I 125 and the area of radioactivity was readily identified. A curvilinear incision was made along the inferior edge of the areola of the left breast. The incision was carried through the skin and subcutaneous tissue sharply with the electrocautery. Once the area of the radioactive seed was approached, then a circular portion of tissue was excised sharply with the electrocautery around the radioactive seed while checking the area of radioactivity frequently. Once the specimen was removed it was oriented with the appropriate paint colors. A specimen radiograph was obtained that showed the clip and seed to be within the specimen. The specimen was then sent to pathology for further evaluation. The  area was then infiltrated with 0.25% marcaine. The deep portion of the wound was then closed with layers of interrupted 3-0 vicryl stitches. The skin was then closed with interrupted 4-0 monocryl subcuticular stitches. Dermabond dressings were applied. The patient tolerated the procedure well. At the end of the case all needle sponge and instrument counts were correct. The patient was then awakened and taken to recovery in stable condition.  PLAN OF CARE: Discharge to home after PACU  PATIENT DISPOSITION:  PACU - hemodynamically stable.   Delay start of Pharmacological VTE agent (>24hrs) due to surgical blood loss or risk of bleeding: not applicable

## 2016-06-30 NOTE — Transfer of Care (Signed)
Immediate Anesthesia Transfer of Care Note  Patient: Shawna Payne  Procedure(s) Performed: Procedure(s): LEFT BREAST LUMPECTOMY WITH RADIOACTIVE SEED LOCALIZATION (Left)  Patient Location: PACU  Anesthesia Type:General  Level of Consciousness: awake, sedated and patient cooperative  Airway & Oxygen Therapy: Patient Spontanous Breathing and Patient connected to face mask oxygen  Post-op Assessment: Report given to RN and Post -op Vital signs reviewed and stable  Post vital signs: Reviewed and stable  Last Vitals:  Vitals:   06/30/16 1130  BP: (!) 167/71  Pulse: 90  Resp: 20  Temp: 36.8 C    Last Pain:  Vitals:   06/30/16 1130  TempSrc: Oral         Complications: No apparent anesthesia complications

## 2016-07-01 ENCOUNTER — Encounter (HOSPITAL_BASED_OUTPATIENT_CLINIC_OR_DEPARTMENT_OTHER): Payer: Self-pay | Admitting: General Surgery

## 2016-07-22 ENCOUNTER — Telehealth: Payer: Self-pay | Admitting: Oncology

## 2016-07-22 NOTE — Telephone Encounter (Signed)
Appt scheduled w/Magrinat on 12/20 at 4pm. Pt aware to arrive 30 minutes early. Demographics verified. Letter mailed.

## 2016-07-25 ENCOUNTER — Encounter: Payer: Self-pay | Admitting: Oncology

## 2016-08-02 ENCOUNTER — Other Ambulatory Visit: Payer: Self-pay | Admitting: *Deleted

## 2016-08-02 DIAGNOSIS — Z1239 Encounter for other screening for malignant neoplasm of breast: Secondary | ICD-10-CM

## 2016-08-03 ENCOUNTER — Other Ambulatory Visit (HOSPITAL_BASED_OUTPATIENT_CLINIC_OR_DEPARTMENT_OTHER): Payer: BLUE CROSS/BLUE SHIELD

## 2016-08-03 ENCOUNTER — Ambulatory Visit (HOSPITAL_BASED_OUTPATIENT_CLINIC_OR_DEPARTMENT_OTHER): Payer: BLUE CROSS/BLUE SHIELD | Admitting: Oncology

## 2016-08-03 DIAGNOSIS — Z801 Family history of malignant neoplasm of trachea, bronchus and lung: Secondary | ICD-10-CM | POA: Diagnosis not present

## 2016-08-03 DIAGNOSIS — Z9189 Other specified personal risk factors, not elsewhere classified: Secondary | ICD-10-CM | POA: Insufficient documentation

## 2016-08-03 DIAGNOSIS — Z1239 Encounter for other screening for malignant neoplasm of breast: Secondary | ICD-10-CM

## 2016-08-03 DIAGNOSIS — Z87891 Personal history of nicotine dependence: Secondary | ICD-10-CM | POA: Diagnosis not present

## 2016-08-03 DIAGNOSIS — N6092 Unspecified benign mammary dysplasia of left breast: Secondary | ICD-10-CM | POA: Diagnosis not present

## 2016-08-03 DIAGNOSIS — N6099 Unspecified benign mammary dysplasia of unspecified breast: Secondary | ICD-10-CM

## 2016-08-03 DIAGNOSIS — N6489 Other specified disorders of breast: Secondary | ICD-10-CM

## 2016-08-03 LAB — COMPREHENSIVE METABOLIC PANEL
ALT: 45 U/L (ref 0–55)
AST: 34 U/L (ref 5–34)
Albumin: 4.1 g/dL (ref 3.5–5.0)
Alkaline Phosphatase: 74 U/L (ref 40–150)
Anion Gap: 12 mEq/L — ABNORMAL HIGH (ref 3–11)
BUN: 9.9 mg/dL (ref 7.0–26.0)
CHLORIDE: 104 meq/L (ref 98–109)
CO2: 23 meq/L (ref 22–29)
Calcium: 9.6 mg/dL (ref 8.4–10.4)
Creatinine: 0.8 mg/dL (ref 0.6–1.1)
EGFR: 75 mL/min/{1.73_m2} — AB (ref >90)
GLUCOSE: 244 mg/dL — AB (ref 70–140)
POTASSIUM: 3.6 meq/L (ref 3.5–5.1)
SODIUM: 138 meq/L (ref 136–145)
Total Bilirubin: 0.54 mg/dL (ref 0.20–1.20)
Total Protein: 7.1 g/dL (ref 6.4–8.3)

## 2016-08-03 LAB — CBC WITH DIFFERENTIAL/PLATELET
BASO%: 0.6 % (ref 0.0–2.0)
BASOS ABS: 0 10*3/uL (ref 0.0–0.1)
EOS ABS: 0.2 10*3/uL (ref 0.0–0.5)
EOS%: 2.3 % (ref 0.0–7.0)
HCT: 42.4 % (ref 34.8–46.6)
HGB: 13.9 g/dL (ref 11.6–15.9)
LYMPH%: 26.6 % (ref 14.0–49.7)
MCH: 27.7 pg (ref 25.1–34.0)
MCHC: 32.8 g/dL (ref 31.5–36.0)
MCV: 84.6 fL (ref 79.5–101.0)
MONO#: 0.4 10*3/uL (ref 0.1–0.9)
MONO%: 6.7 % (ref 0.0–14.0)
NEUT%: 63.8 % (ref 38.4–76.8)
NEUTROS ABS: 4.2 10*3/uL (ref 1.5–6.5)
Platelets: 182 10*3/uL (ref 145–400)
RBC: 5.01 10*6/uL (ref 3.70–5.45)
RDW: 13.8 % (ref 11.2–14.5)
WBC: 6.6 10*3/uL (ref 3.9–10.3)
lymph#: 1.7 10*3/uL (ref 0.9–3.3)

## 2016-08-03 NOTE — Progress Notes (Signed)
[place]  Telephone:([phone]) [phone] Fax:(336) [phone]     ID: MAECEE SCHNELL DOB: [birth year]  MR#: [id number]  [id number]  Patient Care Team: Burnard Bunting, MD as PCP - General (Internal Medicine) Chauncey Cruel, MD OTHER MD:  CHIEF COMPLAINT: Complex sclerosing lesion/ductal hyperplasia without atypia  CURRENT TREATMENT: observation   BREAST CANCER HISTORY: "Shawna Payne" had screening mammography at [facility] generated 20 03/03/2016 showing a new 5 mm oval nodule in the left breast. Breast density was category B. Ultrasound was recommended and confirmed a 7 mm oval lesion in the left breast lower outer quadrant. This was felt to most likely represent a complex cyst and follow-up was recommended.  this was scheduled for 05/03/2016, with diagnostic unilateral left mammography and ultrasonography. The oval mass at the 5:00 position of the left breast was unchanged. However there was now also an oval area of asymmetry more anteriorly and this was biopsied 05/04/2016. The pathology from this procedure (SAA [id number]) showed a complex sclerosing lesion with usual ductal hyperplasia.  Accordingly the patient was referred to surgery and on 06/30/2016 underwent left lumpectomy, with no sentinel lymph node sampling. Final pathology here (SAA [id number]) showed a radial "scar" with usual ductal hyperplasia. There was no atypia or malignancy.  Her subsequent history is as detailed below  INTERVAL HISTORY: Shawna Payne was evaluated in the high-risk clinic 08/03/2016 accompanied by her husband Shawna Payne.   REVIEW OF SYSTEMS: There were no specific symptoms leading to the original mammogram, which was routinely scheduled. The patient denies unusual headaches, visual changes, nausea, vomiting, stiff neck, dizziness, or gait imbalance. There has been no cough, phlegm production, or pleurisy, no chest pain or pressure, and no change in bowel or bladder habits. The patient denies fever, rash, bleeding,  unexplained fatigue or unexplained weight loss. Shawna Payne had knee surgery less than a montago and she is still rehabbing. She usually exercises by walking. A detailed review of systems was otherwise entirely negative.   PAST MEDICAL HISTORY: Past Medical History:  Diagnosis Date  . Breast mass, left   . Complication of anesthesia   . Diabetes mellitus without complication ([place])   . PONV (postoperative nausea and vomiting)   . Seasonal allergies     PAST SURGICAL HISTORY: Past Surgical History:  Procedure Laterality Date  . BREAST LUMPECTOMY WITH RADIOACTIVE SEED LOCALIZATION Left 06/30/2016   Procedure: LEFT BREAST LUMPECTOMY WITH RADIOACTIVE SEED LOCALIZATION;  Surgeon: Autumn Messing III, MD;  Location: [facility];  Service: General;  Laterality: Left;  . BREAST SURGERY Left    papilloma  . CHOLECYSTECTOMY    . KNEE ARTHROSCOPY Left   . TONSILLECTOMY      FAMILY HISTORY No family history on file. The patient's father died from what may have been throat cancer at the age of 83. The patient's mother died from "natural causes" at 27.The patient had one sister, no brothers. The patient's mother was diagnosed with lung cancer at the age of 45, and cured with standard treatment. She was not a smoker. There is no history of breast or ovarian cancer in the family to the patient's knowledge  GYNECOLOGIC HISTORY:  No LMP recorded. Patient is postmenopausal. Menarche age 16, first live birth age 56. The patient is GX P2. She went through the change of life in her mid 32s. She did not use hormone replacement. She used oral contraceptives for less than 2 years remotely with no clotting complications  SOCIAL HISTORY:  Works as a Print production planner. Her husband Shawna Payne  Shawna Payne") is a Orthoptist. Their son Shawna Payne lives in [place] and he teaches multi media at [employer] in the school of journalism. The patient's daughter Shawna Payne lives in [place] and is a  homemaker.     ADVANCED DIRECTIVES: not in place   HEALTH MAINTENANCE: Social History  Substance Use Topics  . Smoking status: Former Research scientist (life sciences)  . Smokeless tobacco: Never Used  . Alcohol use No     Colonoscopy: never  PAP: 2015  Bone density: remote (1995?)   Allergies  Allergen Reactions  . Erythromycin Diarrhea    Severe N/V  . Penicillins Swelling  . Sulfa Antibiotics     Cannot remember reaction     Current Outpatient Prescriptions  Medication Sig Dispense Refill  . HYDROcodone-acetaminophen (NORCO/VICODIN) 5-325 MG tablet Take 1-2 tablets by mouth every 4 (four) hours as needed for moderate pain or severe pain. 20 tablet 0  . metFORMIN (GLUCOPHAGE) 500 MG tablet Take by mouth 2 (two) times daily with a meal.     No current facility-administered medications for this visit.     OBJECTIVE: middle-aged white woman in no acute distress Vitals:   08/03/16 1604  BP: (!) 187/79  Pulse: (!) 101  Resp: 18  Temp: 98.3 F (36.8 C)     Body mass index is 27.5 kg/m.    ECOG FS:1 - Symptomatic but completely ambulatory  Ocular: Sclerae unicteric, pupils equal, round and reactive to light Ear-nose-throat: Oropharynx clear and moist Lymphatic: No cervical or supraclavicular adenopathy Lungs no rales or rhonchi, good excursion bilaterally Heart regular rate and rhythm, no murmur appreciated Abd soft, nontender, positive bowel sounds MSK mild kyphosis but no focal spinal tenderness, no joint edema Neuro: non-focal, well-oriented, appropriate affect Breasts: the right breast is unremarkable. The left breast is status post recent lumpectomy. The cosmetic result is excellent. There is no dehiscence, swelling, or erythema. The left axilla is benign.   LAB RESULTS:  CMP     Component Value Date/Time   NA 139 06/23/2016 1522   K 3.7 06/23/2016 1522   CL 100 (L) 06/23/2016 1522   CO2 27 06/23/2016 1522   GLUCOSE 214 (H) 06/23/2016 1522   BUN 9 06/23/2016 1522    CREATININE 0.80 06/23/2016 1522   CALCIUM 9.6 06/23/2016 1522   GFRNONAA >60 06/23/2016 1522   GFRAA >60 06/23/2016 1522    INo results found for: SPEP, UPEP  Lab Results  Component Value Date   WBC 6.6 08/03/2016   NEUTROABS 4.2 08/03/2016   HGB 13.9 08/03/2016   HCT 42.4 08/03/2016   MCV 84.6 08/03/2016   PLT 182 08/03/2016      Chemistry      Component Value Date/Time   NA 139 06/23/2016 1522   K 3.7 06/23/2016 1522   CL 100 (L) 06/23/2016 1522   CO2 27 06/23/2016 1522   BUN 9 06/23/2016 1522   CREATININE 0.80 06/23/2016 1522      Component Value Date/Time   CALCIUM 9.6 06/23/2016 1522       No results found for: LABCA2  No components found for: [id number]  No results for input(s): INR in the last 168 hours.  Urinalysis No results found for: COLORURINE, APPEARANCEUR, LABSPEC, PHURINE, GLUCOSEU, HGBUR, BILIRUBINUR, KETONESUR, PROTEINUR, UROBILINOGEN, NITRITE, LEUKOCYTESUR   STUDIES: No results found.  ELIGIBLE FOR AVAILABLE RESEARCH PROTOCOL: no  ASSESSMENT: 65 y.o. [place], [place] woman status post left lumpectomy for a radial scar showing usual ductal hyperplasia but no atypia or  malignancy  PLAN: We spent the better part of today's hour-long appointment discussing breast cancer risk in general and Pams specific situation.  We started by clarifying the meaning of her pathology. A radial "scar" is not really a scar, to begin with. It is a sclerosing process which is generally self-limiting and does not necessarily lead to malignancy. However it is frequently associated with malignancy and therefore she needed to have the lumpectomy. Luckily this showed only hyperplasia.  While the word hyperplasia indicates abnormal growth of the ducts or milk tubes in her breast, there was no atypia. This means the cells did not look transformed or on the way to malignancy.  Nevertheless a patient with usual hyperplasia even without atypia will be at higher risk  of developing breast cancer than a patient without this development. The actual risk will be in the range of 1/2% per year. This means Pams risk of developing breast cancer in the next 5 years is approximately 2.5%, and in the next 20 years (which would take her to the average life span of 58) a 10% risk.  There are 2 well documented ways of lowering that risk. One of course would be bilateral mastectomies. This is not recommended for this level of risk, and it would not be covered by insurance. We eliminated that possibility.  A more reasonable choice is to take anti-estrogens. In her situation, raloxifene/Evista might be particularly attractive. It would help with osteopenia problems and it would cut the risk of her developing breast cancer by 50%.  Even though this is a significant relative reduction, it is a very small absolute reduction. It means that if I had 100 women like her and all 100 took raloxifene for 5 years only 5 would benefit. 5 would have breast cancer developing in any case and 90 were not going to have breast cancer develop. Given these numbers Shawna Payne is not motivated to pursue a preventive strategy.  A second way to address her increased breast cancer risk might be to intensify screening. This could be done with yearly MRIs. These are more sensitive, but also much more expensive. The real problem however is false positives. MRIs of the breast are simply too sensitive in this setting and they are much more likely to find abnormalities which are not cancer but which will have to be followed up with biopsy and other procedures which would put her at risk. In addition, her breast density is B, meaning it is very easy to see through her breasts and therefore mammography is sensitive in her situation  In short my recommendation to her as far as screening is concerned is that she continue to have mammography once a year, with tomography (3-D) as well as a yearly or biannual physician breast  exam.  There is some equivocal data that exercise to the tune of 45 minutes 5 times a week in addition to helping her diabetes, bone strength, cardiovascular health, and mood, can decrease the general risk of developing cancer by 2% or so. Certainly once her knee heals it would be in her interest to start a vigorous exercise program.  Incidentally I also strongly urged her to follow Dr. Harley Hallmark recommendation that she obtain a colonoscopy.  Shawna Payne has a good understanding of her situation and of the above discussion. I do not see a need for her to return here but of course I will be glad to see her at any point in the future  Chauncey Cruel, MD   08/03/2016  4:15 PM Medical Oncology and Hematology [facility] [address] [place], [place] [address] Tel. [phone]    Fax. [phone]

## 2016-08-04 ENCOUNTER — Encounter: Payer: Self-pay | Admitting: Oncology

## 2016-08-04 LAB — DRAW EXTRA CLOT TUBE

## 2016-12-16 ENCOUNTER — Encounter: Payer: Self-pay | Admitting: Internal Medicine

## 2017-09-03 DIAGNOSIS — J069 Acute upper respiratory infection, unspecified: Secondary | ICD-10-CM | POA: Diagnosis not present

## 2017-09-03 DIAGNOSIS — J020 Streptococcal pharyngitis: Secondary | ICD-10-CM | POA: Diagnosis not present

## 2017-09-03 DIAGNOSIS — R509 Fever, unspecified: Secondary | ICD-10-CM | POA: Diagnosis not present

## 2017-09-03 DIAGNOSIS — J029 Acute pharyngitis, unspecified: Secondary | ICD-10-CM | POA: Diagnosis not present

## 2017-09-05 DIAGNOSIS — Z6826 Body mass index (BMI) 26.0-26.9, adult: Secondary | ICD-10-CM | POA: Diagnosis not present

## 2017-09-05 DIAGNOSIS — J029 Acute pharyngitis, unspecified: Secondary | ICD-10-CM | POA: Diagnosis not present

## 2017-09-05 DIAGNOSIS — R05 Cough: Secondary | ICD-10-CM | POA: Diagnosis not present

## 2017-09-05 DIAGNOSIS — J069 Acute upper respiratory infection, unspecified: Secondary | ICD-10-CM | POA: Diagnosis not present

## 2017-09-06 DIAGNOSIS — E7849 Other hyperlipidemia: Secondary | ICD-10-CM | POA: Diagnosis not present

## 2017-09-06 DIAGNOSIS — Z6826 Body mass index (BMI) 26.0-26.9, adult: Secondary | ICD-10-CM | POA: Diagnosis not present

## 2017-09-06 DIAGNOSIS — E119 Type 2 diabetes mellitus without complications: Secondary | ICD-10-CM | POA: Diagnosis not present

## 2017-09-06 DIAGNOSIS — Z1389 Encounter for screening for other disorder: Secondary | ICD-10-CM | POA: Diagnosis not present

## 2017-09-06 DIAGNOSIS — J029 Acute pharyngitis, unspecified: Secondary | ICD-10-CM | POA: Diagnosis not present

## 2017-09-06 DIAGNOSIS — M1990 Unspecified osteoarthritis, unspecified site: Secondary | ICD-10-CM | POA: Diagnosis not present

## 2017-09-06 DIAGNOSIS — F329 Major depressive disorder, single episode, unspecified: Secondary | ICD-10-CM | POA: Diagnosis not present

## 2017-11-15 DIAGNOSIS — R928 Other abnormal and inconclusive findings on diagnostic imaging of breast: Secondary | ICD-10-CM | POA: Diagnosis not present

## 2017-12-22 DIAGNOSIS — E7849 Other hyperlipidemia: Secondary | ICD-10-CM | POA: Diagnosis not present

## 2017-12-22 DIAGNOSIS — R82998 Other abnormal findings in urine: Secondary | ICD-10-CM | POA: Diagnosis not present

## 2017-12-22 DIAGNOSIS — E119 Type 2 diabetes mellitus without complications: Secondary | ICD-10-CM | POA: Diagnosis not present

## 2017-12-28 DIAGNOSIS — E7849 Other hyperlipidemia: Secondary | ICD-10-CM | POA: Diagnosis not present

## 2017-12-28 DIAGNOSIS — E1169 Type 2 diabetes mellitus with other specified complication: Secondary | ICD-10-CM | POA: Diagnosis not present

## 2017-12-28 DIAGNOSIS — G43909 Migraine, unspecified, not intractable, without status migrainosus: Secondary | ICD-10-CM | POA: Diagnosis not present

## 2017-12-28 DIAGNOSIS — Z6826 Body mass index (BMI) 26.0-26.9, adult: Secondary | ICD-10-CM | POA: Diagnosis not present

## 2017-12-28 DIAGNOSIS — Z1389 Encounter for screening for other disorder: Secondary | ICD-10-CM | POA: Diagnosis not present

## 2017-12-28 DIAGNOSIS — M1990 Unspecified osteoarthritis, unspecified site: Secondary | ICD-10-CM | POA: Diagnosis not present

## 2017-12-28 DIAGNOSIS — Z0000 Encounter for general adult medical examination without abnormal findings: Secondary | ICD-10-CM | POA: Diagnosis not present

## 2017-12-28 DIAGNOSIS — Z23 Encounter for immunization: Secondary | ICD-10-CM | POA: Diagnosis not present

## 2017-12-28 DIAGNOSIS — E663 Overweight: Secondary | ICD-10-CM | POA: Diagnosis not present

## 2017-12-28 DIAGNOSIS — N6022 Fibroadenosis of left breast: Secondary | ICD-10-CM | POA: Diagnosis not present

## 2017-12-28 DIAGNOSIS — F3289 Other specified depressive episodes: Secondary | ICD-10-CM | POA: Diagnosis not present

## 2017-12-28 DIAGNOSIS — L718 Other rosacea: Secondary | ICD-10-CM | POA: Diagnosis not present

## 2018-01-05 DIAGNOSIS — D23111 Other benign neoplasm of skin of right upper eyelid, including canthus: Secondary | ICD-10-CM | POA: Diagnosis not present

## 2018-01-05 DIAGNOSIS — H2513 Age-related nuclear cataract, bilateral: Secondary | ICD-10-CM | POA: Diagnosis not present

## 2018-01-05 DIAGNOSIS — H353131 Nonexudative age-related macular degeneration, bilateral, early dry stage: Secondary | ICD-10-CM | POA: Diagnosis not present

## 2018-01-05 DIAGNOSIS — Z1212 Encounter for screening for malignant neoplasm of rectum: Secondary | ICD-10-CM | POA: Diagnosis not present

## 2018-01-05 DIAGNOSIS — H00024 Hordeolum internum left upper eyelid: Secondary | ICD-10-CM | POA: Diagnosis not present

## 2018-01-05 DIAGNOSIS — H04123 Dry eye syndrome of bilateral lacrimal glands: Secondary | ICD-10-CM | POA: Diagnosis not present

## 2018-01-10 DIAGNOSIS — D23111 Other benign neoplasm of skin of right upper eyelid, including canthus: Secondary | ICD-10-CM | POA: Diagnosis not present

## 2018-01-10 DIAGNOSIS — H00024 Hordeolum internum left upper eyelid: Secondary | ICD-10-CM | POA: Diagnosis not present

## 2018-01-10 DIAGNOSIS — H2513 Age-related nuclear cataract, bilateral: Secondary | ICD-10-CM | POA: Diagnosis not present

## 2018-01-23 ENCOUNTER — Encounter: Payer: Self-pay | Admitting: Gastroenterology

## 2018-02-06 DIAGNOSIS — L564 Polymorphous light eruption: Secondary | ICD-10-CM | POA: Diagnosis not present

## 2018-03-26 ENCOUNTER — Encounter: Payer: Self-pay | Admitting: Gastroenterology

## 2018-03-26 ENCOUNTER — Encounter: Payer: BLUE CROSS/BLUE SHIELD | Admitting: Gastroenterology

## 2018-05-22 DIAGNOSIS — Z23 Encounter for immunization: Secondary | ICD-10-CM | POA: Diagnosis not present

## 2018-05-25 ENCOUNTER — Ambulatory Visit (AMBULATORY_SURGERY_CENTER): Payer: Self-pay

## 2018-05-25 ENCOUNTER — Encounter: Payer: Self-pay | Admitting: Gastroenterology

## 2018-05-25 ENCOUNTER — Other Ambulatory Visit: Payer: Self-pay | Admitting: Gastroenterology

## 2018-05-25 VITALS — Ht 67.0 in | Wt 171.0 lb

## 2018-05-25 DIAGNOSIS — Z1211 Encounter for screening for malignant neoplasm of colon: Secondary | ICD-10-CM

## 2018-05-25 MED ORDER — PEG-KCL-NACL-NASULF-NA ASC-C 140 G PO SOLR: 1.0000 | Freq: Once | ORAL | 0 refills | Status: DC

## 2018-05-25 NOTE — Progress Notes (Signed)
Denies allergies to eggs or soy products. Denies complication of anesthesia or sedation. Denies use of weight loss medication. Denies use of O2.   Emmi instructions declined.   A sample of Plenvu was given to the patient. Her new insurance was [employer] and they do not cover Plenvu.

## 2018-05-25 NOTE — Telephone Encounter (Signed)
A pay no more than $50 coupon has been sent to her pharmacy.

## 2018-05-25 NOTE — Telephone Encounter (Signed)
Needs alternate prep covered by insurance or prep sample for upcoming procedure

## 2018-06-05 ENCOUNTER — Telehealth: Payer: Self-pay | Admitting: Gastroenterology

## 2018-06-05 DIAGNOSIS — D23111 Other benign neoplasm of skin of right upper eyelid, including canthus: Secondary | ICD-10-CM | POA: Diagnosis not present

## 2018-06-05 DIAGNOSIS — H353131 Nonexudative age-related macular degeneration, bilateral, early dry stage: Secondary | ICD-10-CM | POA: Diagnosis not present

## 2018-06-05 DIAGNOSIS — H04123 Dry eye syndrome of bilateral lacrimal glands: Secondary | ICD-10-CM | POA: Diagnosis not present

## 2018-06-05 DIAGNOSIS — H2513 Age-related nuclear cataract, bilateral: Secondary | ICD-10-CM | POA: Diagnosis not present

## 2018-06-05 NOTE — Telephone Encounter (Signed)
Routed to Dr. Danis. 

## 2018-06-05 NOTE — Telephone Encounter (Signed)
She can take Thursday antibiotic doses with a few crackers, a little applesauce or yogurt.  Just be sure to take any late day dose at least 2 hours before prep begins.  She can take Fri Abx doses after returning home from procedure.

## 2018-06-06 NOTE — Telephone Encounter (Signed)
Left message for patient to call back about antibiotics.

## 2018-06-06 NOTE — Telephone Encounter (Signed)
Patient advised on how to take the medication, (doxycycline BID).

## 2018-06-08 ENCOUNTER — Ambulatory Visit (AMBULATORY_SURGERY_CENTER): Payer: PPO | Admitting: Gastroenterology

## 2018-06-08 ENCOUNTER — Encounter: Payer: Self-pay | Admitting: Gastroenterology

## 2018-06-08 VITALS — BP 139/73 | HR 70 | Temp 97.8°F | Resp 11 | Ht 67.0 in | Wt 171.0 lb

## 2018-06-08 DIAGNOSIS — D123 Benign neoplasm of transverse colon: Secondary | ICD-10-CM | POA: Diagnosis not present

## 2018-06-08 DIAGNOSIS — D129 Benign neoplasm of anus and anal canal: Secondary | ICD-10-CM

## 2018-06-08 DIAGNOSIS — K621 Rectal polyp: Secondary | ICD-10-CM

## 2018-06-08 DIAGNOSIS — K635 Polyp of colon: Secondary | ICD-10-CM

## 2018-06-08 DIAGNOSIS — Z1211 Encounter for screening for malignant neoplasm of colon: Secondary | ICD-10-CM

## 2018-06-08 DIAGNOSIS — D128 Benign neoplasm of rectum: Secondary | ICD-10-CM

## 2018-06-08 MED ORDER — SODIUM CHLORIDE 0.9 % IV SOLN: 500.0000 mL | Freq: Once | INTRAVENOUS | Status: DC

## 2018-06-08 NOTE — Progress Notes (Signed)
Called to room to assist during endoscopic procedure.  Patient ID and intended procedure confirmed with present staff. Received instructions for my participation in the procedure from the performing physician.  

## 2018-06-08 NOTE — Progress Notes (Signed)
Pt's states no medical or surgical changes since previsit or office visit. Eye infection 06/05/2018. Currently on antibiotics.

## 2018-06-08 NOTE — Progress Notes (Signed)
PT taken to PACU. Monitors in place. VSS. Report given to RN. 

## 2018-06-08 NOTE — Patient Instructions (Signed)
Handouts: Polyps  YOU HAD AN ENDOSCOPIC PROCEDURE TODAY AT THE [facility]:   Refer to the procedure report that was given to you for any specific questions about what was found during the examination.  If the procedure report does not answer your questions, please call your gastroenterologist to clarify.  If you requested that your care partner not be given the details of your procedure findings, then the procedure report has been included in a sealed envelope for you to review at your convenience later.  YOU SHOULD EXPECT: Some feelings of bloating in the abdomen. Passage of more gas than usual.  Walking can help get rid of the air that was put into your GI tract during the procedure and reduce the bloating. If you had a lower endoscopy (such as a colonoscopy or flexible sigmoidoscopy) you may notice spotting of blood in your stool or on the toilet paper. If you underwent a bowel prep for your procedure, you may not have a normal bowel movement for a few days.  Please Note:  You might notice some irritation and congestion in your nose or some drainage.  This is from the oxygen used during your procedure.  There is no need for concern and it should clear up in a day or so.  SYMPTOMS TO REPORT IMMEDIATELY:   Following lower endoscopy (colonoscopy or flexible sigmoidoscopy):  Excessive amounts of blood in the stool  Significant tenderness or worsening of abdominal pains  Swelling of the abdomen that is new, acute  Fever of 100F or higher  For urgent or emergent issues, a gastroenterologist can be reached at any hour by calling [phone].   DIET:  We do recommend a small meal at first, but then you may proceed to your regular diet.  Drink plenty of fluids but you should avoid alcoholic beverages for 24 hours.  ACTIVITY:  You should plan to take it easy for the rest of today and you should NOT DRIVE or use heavy machinery until tomorrow (because of the sedation medicines used  during the test).    FOLLOW UP: Our staff will call the number listed on your records the next business day following your procedure to check on you and address any questions or concerns that you may have regarding the information given to you following your procedure. If we do not reach you, we will leave a message.  However, if you are feeling well and you are not experiencing any problems, there is no need to return our call.  We will assume that you have returned to your regular daily activities without incident.  If any biopsies were taken you will be contacted by phone or by letter within the next 1-3 weeks.  Please call us at [phone] if you have not heard about the biopsies in 3 weeks.    SIGNATURES/CONFIDENTIALITY: You and/or your care partner have signed paperwork which will be entered into your electronic medical record.  These signatures attest to the fact that that the information above on your After Visit Summary has been reviewed and is understood.  Full responsibility of the confidentiality of this discharge information lies with you and/or your care-partner. 

## 2018-06-08 NOTE — Op Note (Signed)
[place] Patient Name: Shawna Payne Procedure Date: 06/08/2018 7:29 AM MRN: [id number] Endoscopist: Alvo. Loletha Carrow , MD Age: 67 Referring MD:  Date of Birth: [birth year] Gender: Female Account #: [id number] Procedure:                Colonoscopy Indications:              Screening for colorectal malignant neoplasm, This                            is the patient's first colonoscopy Medicines:                Monitored Anesthesia Care Procedure:                Pre-Anesthesia Assessment:                           - Prior to the procedure, a History and Physical                            was performed, and patient medications and                            allergies were reviewed. The patient's tolerance of                            previous anesthesia was also reviewed. The risks                            and benefits of the procedure and the sedation                            options and risks were discussed with the patient.                            All questions were answered, and informed consent                            was obtained. Prior Anticoagulants: The patient has                            taken no previous anticoagulant or antiplatelet                            agents. ASA Grade Assessment: II - A patient with                            mild systemic disease. After reviewing the risks                            and benefits, the patient was deemed in                            satisfactory condition to undergo the procedure.  After obtaining informed consent, the colonoscope                            was passed under direct vision. Throughout the                            procedure, the patient's blood pressure, pulse, and                            oxygen saturations were monitored continuously. The                            Colonoscope was introduced through the anus and                            advanced to the the cecum,  identified by                            appendiceal orifice and ileocecal valve. The                            colonoscopy was performed without difficulty. The                            patient tolerated the procedure well. The quality                            of the bowel preparation was good. The appendiceal                            orifice and rectum were photographed. The quality                            of the bowel preparation was evaluated using the                            BBPS [facility] Bowel Preparation Scale) with scores                            of: Right Colon = 2, Transverse Colon = 2 and Left                            Colon = 2. The total BBPS score equals 6. Scope In: 8:10:45 AM Scope Out: 8:25:37 AM Scope Withdrawal Time: 0 hours 12 minutes 53 seconds  Total Procedure Duration: 0 hours 14 minutes 52 seconds  Findings:                 The perianal and digital rectal examinations were                            normal.                           A 6 mm polyp was found  in the transverse colon. The                            polyp was sessile. The polyp was removed with a                            cold snare. Resection and retrieval were complete.                           A 3 mm polyp was found in the rectum. The polyp was                            sessile. The polyp was removed with a cold snare.                            Resection and retrieval were complete.                           There was a lipoma in the transverse colon.                           Diverticula were found in the left colon and right                            colon.                           The exam was otherwise without abnormality on                            direct and retroflexion views. Complications:            No immediate complications. Estimated Blood Loss:     Estimated blood loss was minimal. Impression:               - One 6 mm polyp in the transverse colon, removed                             with a cold snare. Resected and retrieved.                           - One 3 mm polyp in the rectum, removed with a cold                            snare. Resected and retrieved.                           - Lipoma in the transverse colon.                           - Diverticulosis in the left colon and in the right                            colon.                           -  The examination was otherwise normal on direct                            and retroflexion views. Recommendation:           - Patient has a contact number available for                            emergencies. The signs and symptoms of potential                            delayed complications were discussed with the                            patient. Return to normal activities tomorrow.                            Written discharge instructions were provided to the                            patient.                           - Resume previous diet.                           - Continue present medications.                           - Await pathology results.                           - Repeat colonoscopy is recommended for                            surveillance. The colonoscopy date will be                            determined after pathology results from today's                            exam become available for review. Shawna Payne L. Loletha Carrow, MD 06/08/2018 8:32:03 AM This report has been signed electronically.

## 2018-06-11 ENCOUNTER — Telehealth: Payer: Self-pay

## 2018-06-11 NOTE — Telephone Encounter (Signed)
  Follow up Call-  Call back number 06/08/2018  Post procedure Call Back phone  # [phone]  Permission to leave phone message Yes  Some recent data might be hidden     Patient questions:  Do you have a fever, pain , or abdominal swelling? No. Pain Score  0 *  Have you tolerated food without any problems? Yes.    Have you been able to return to your normal activities? Yes.    Do you have any questions about your discharge instructions: Diet   No. Medications  No. Follow up visit  No.  Do you have questions or concerns about your Care? No.  Actions: * If pain score is 4 or above: No action needed, pain <4.

## 2018-06-12 DIAGNOSIS — H524 Presbyopia: Secondary | ICD-10-CM | POA: Diagnosis not present

## 2018-06-12 DIAGNOSIS — H5203 Hypermetropia, bilateral: Secondary | ICD-10-CM | POA: Diagnosis not present

## 2018-06-14 ENCOUNTER — Encounter: Payer: Self-pay | Admitting: Gastroenterology

## 2018-06-19 DIAGNOSIS — L814 Other melanin hyperpigmentation: Secondary | ICD-10-CM | POA: Diagnosis not present

## 2018-06-19 DIAGNOSIS — D225 Melanocytic nevi of trunk: Secondary | ICD-10-CM | POA: Diagnosis not present

## 2018-06-19 DIAGNOSIS — L7211 Pilar cyst: Secondary | ICD-10-CM | POA: Diagnosis not present

## 2018-06-19 DIAGNOSIS — Z85828 Personal history of other malignant neoplasm of skin: Secondary | ICD-10-CM | POA: Diagnosis not present

## 2018-06-19 DIAGNOSIS — L821 Other seborrheic keratosis: Secondary | ICD-10-CM | POA: Diagnosis not present

## 2018-06-19 DIAGNOSIS — D2271 Melanocytic nevi of right lower limb, including hip: Secondary | ICD-10-CM | POA: Diagnosis not present

## 2018-06-19 DIAGNOSIS — C4441 Basal cell carcinoma of skin of scalp and neck: Secondary | ICD-10-CM | POA: Diagnosis not present

## 2018-06-19 DIAGNOSIS — D1801 Hemangioma of skin and subcutaneous tissue: Secondary | ICD-10-CM | POA: Diagnosis not present

## 2018-06-19 DIAGNOSIS — D485 Neoplasm of uncertain behavior of skin: Secondary | ICD-10-CM | POA: Diagnosis not present

## 2018-06-19 DIAGNOSIS — D2261 Melanocytic nevi of right upper limb, including shoulder: Secondary | ICD-10-CM | POA: Diagnosis not present

## 2018-06-19 DIAGNOSIS — L918 Other hypertrophic disorders of the skin: Secondary | ICD-10-CM | POA: Diagnosis not present

## 2018-06-20 DIAGNOSIS — E1169 Type 2 diabetes mellitus with other specified complication: Secondary | ICD-10-CM | POA: Diagnosis not present

## 2018-06-20 DIAGNOSIS — E663 Overweight: Secondary | ICD-10-CM | POA: Diagnosis not present

## 2018-06-20 DIAGNOSIS — M1990 Unspecified osteoarthritis, unspecified site: Secondary | ICD-10-CM | POA: Diagnosis not present

## 2018-06-20 DIAGNOSIS — F329 Major depressive disorder, single episode, unspecified: Secondary | ICD-10-CM | POA: Diagnosis not present

## 2018-06-20 DIAGNOSIS — L719 Rosacea, unspecified: Secondary | ICD-10-CM | POA: Diagnosis not present

## 2018-06-20 DIAGNOSIS — G43909 Migraine, unspecified, not intractable, without status migrainosus: Secondary | ICD-10-CM | POA: Diagnosis not present

## 2018-06-20 DIAGNOSIS — Z6827 Body mass index (BMI) 27.0-27.9, adult: Secondary | ICD-10-CM | POA: Diagnosis not present

## 2018-06-20 DIAGNOSIS — E7849 Other hyperlipidemia: Secondary | ICD-10-CM | POA: Diagnosis not present

## 2018-06-20 DIAGNOSIS — N6022 Fibroadenosis of left breast: Secondary | ICD-10-CM | POA: Diagnosis not present

## 2018-06-28 DIAGNOSIS — C4441 Basal cell carcinoma of skin of scalp and neck: Secondary | ICD-10-CM | POA: Diagnosis not present

## 2018-06-28 DIAGNOSIS — Z85828 Personal history of other malignant neoplasm of skin: Secondary | ICD-10-CM | POA: Diagnosis not present

## 2018-07-05 DIAGNOSIS — Z4802 Encounter for removal of sutures: Secondary | ICD-10-CM | POA: Diagnosis not present

## 2018-09-09 DIAGNOSIS — J019 Acute sinusitis, unspecified: Secondary | ICD-10-CM | POA: Diagnosis not present

## 2018-10-05 DIAGNOSIS — R52 Pain, unspecified: Secondary | ICD-10-CM | POA: Diagnosis not present

## 2018-10-05 DIAGNOSIS — J329 Chronic sinusitis, unspecified: Secondary | ICD-10-CM | POA: Diagnosis not present

## 2018-10-05 DIAGNOSIS — I10 Essential (primary) hypertension: Secondary | ICD-10-CM | POA: Diagnosis not present

## 2018-10-05 DIAGNOSIS — R05 Cough: Secondary | ICD-10-CM | POA: Diagnosis not present

## 2018-10-05 DIAGNOSIS — Z6827 Body mass index (BMI) 27.0-27.9, adult: Secondary | ICD-10-CM | POA: Diagnosis not present

## 2018-10-05 DIAGNOSIS — J029 Acute pharyngitis, unspecified: Secondary | ICD-10-CM | POA: Diagnosis not present

## 2018-10-05 DIAGNOSIS — E1169 Type 2 diabetes mellitus with other specified complication: Secondary | ICD-10-CM | POA: Diagnosis not present

## 2018-10-17 DIAGNOSIS — M79671 Pain in right foot: Secondary | ICD-10-CM | POA: Diagnosis not present

## 2018-10-17 DIAGNOSIS — M25561 Pain in right knee: Secondary | ICD-10-CM | POA: Diagnosis not present

## 2018-10-22 DIAGNOSIS — E663 Overweight: Secondary | ICD-10-CM | POA: Diagnosis not present

## 2018-10-22 DIAGNOSIS — F329 Major depressive disorder, single episode, unspecified: Secondary | ICD-10-CM | POA: Diagnosis not present

## 2018-10-22 DIAGNOSIS — E1169 Type 2 diabetes mellitus with other specified complication: Secondary | ICD-10-CM | POA: Diagnosis not present

## 2018-10-22 DIAGNOSIS — M1990 Unspecified osteoarthritis, unspecified site: Secondary | ICD-10-CM | POA: Diagnosis not present

## 2018-10-22 DIAGNOSIS — E7849 Other hyperlipidemia: Secondary | ICD-10-CM | POA: Diagnosis not present

## 2018-10-22 DIAGNOSIS — G43909 Migraine, unspecified, not intractable, without status migrainosus: Secondary | ICD-10-CM | POA: Diagnosis not present

## 2018-10-22 DIAGNOSIS — L719 Rosacea, unspecified: Secondary | ICD-10-CM | POA: Diagnosis not present

## 2018-10-22 DIAGNOSIS — I10 Essential (primary) hypertension: Secondary | ICD-10-CM | POA: Diagnosis not present

## 2018-10-22 DIAGNOSIS — N6022 Fibroadenosis of left breast: Secondary | ICD-10-CM | POA: Diagnosis not present

## 2018-10-29 DIAGNOSIS — I10 Essential (primary) hypertension: Secondary | ICD-10-CM | POA: Diagnosis not present

## 2018-10-29 DIAGNOSIS — R42 Dizziness and giddiness: Secondary | ICD-10-CM | POA: Diagnosis not present

## 2018-11-12 DIAGNOSIS — I10 Essential (primary) hypertension: Secondary | ICD-10-CM | POA: Diagnosis not present

## 2018-11-12 DIAGNOSIS — R42 Dizziness and giddiness: Secondary | ICD-10-CM | POA: Diagnosis not present

## 2018-11-28 DIAGNOSIS — Z85828 Personal history of other malignant neoplasm of skin: Secondary | ICD-10-CM | POA: Diagnosis not present

## 2018-11-28 DIAGNOSIS — D485 Neoplasm of uncertain behavior of skin: Secondary | ICD-10-CM | POA: Diagnosis not present

## 2018-11-28 DIAGNOSIS — D045 Carcinoma in situ of skin of trunk: Secondary | ICD-10-CM | POA: Diagnosis not present

## 2018-11-28 DIAGNOSIS — L7211 Pilar cyst: Secondary | ICD-10-CM | POA: Diagnosis not present

## 2018-12-03 DIAGNOSIS — L719 Rosacea, unspecified: Secondary | ICD-10-CM | POA: Diagnosis not present

## 2018-12-03 DIAGNOSIS — E663 Overweight: Secondary | ICD-10-CM | POA: Diagnosis not present

## 2018-12-03 DIAGNOSIS — Z1331 Encounter for screening for depression: Secondary | ICD-10-CM | POA: Diagnosis not present

## 2018-12-03 DIAGNOSIS — M1990 Unspecified osteoarthritis, unspecified site: Secondary | ICD-10-CM | POA: Diagnosis not present

## 2018-12-03 DIAGNOSIS — F329 Major depressive disorder, single episode, unspecified: Secondary | ICD-10-CM | POA: Diagnosis not present

## 2018-12-03 DIAGNOSIS — E785 Hyperlipidemia, unspecified: Secondary | ICD-10-CM | POA: Diagnosis not present

## 2018-12-03 DIAGNOSIS — E1169 Type 2 diabetes mellitus with other specified complication: Secondary | ICD-10-CM | POA: Diagnosis not present

## 2018-12-03 DIAGNOSIS — I10 Essential (primary) hypertension: Secondary | ICD-10-CM | POA: Diagnosis not present

## 2018-12-03 DIAGNOSIS — G43909 Migraine, unspecified, not intractable, without status migrainosus: Secondary | ICD-10-CM | POA: Diagnosis not present

## 2018-12-03 DIAGNOSIS — N6022 Fibroadenosis of left breast: Secondary | ICD-10-CM | POA: Diagnosis not present

## 2018-12-05 DIAGNOSIS — Z85828 Personal history of other malignant neoplasm of skin: Secondary | ICD-10-CM | POA: Diagnosis not present

## 2018-12-05 DIAGNOSIS — D045 Carcinoma in situ of skin of trunk: Secondary | ICD-10-CM | POA: Diagnosis not present

## 2019-02-11 DIAGNOSIS — Z1231 Encounter for screening mammogram for malignant neoplasm of breast: Secondary | ICD-10-CM | POA: Diagnosis not present

## 2019-02-11 DIAGNOSIS — I10 Essential (primary) hypertension: Secondary | ICD-10-CM | POA: Diagnosis not present

## 2019-02-11 DIAGNOSIS — E1169 Type 2 diabetes mellitus with other specified complication: Secondary | ICD-10-CM | POA: Diagnosis not present

## 2019-02-11 DIAGNOSIS — R82998 Other abnormal findings in urine: Secondary | ICD-10-CM | POA: Diagnosis not present

## 2019-02-14 DIAGNOSIS — F329 Major depressive disorder, single episode, unspecified: Secondary | ICD-10-CM | POA: Diagnosis not present

## 2019-02-14 DIAGNOSIS — E785 Hyperlipidemia, unspecified: Secondary | ICD-10-CM | POA: Diagnosis not present

## 2019-02-14 DIAGNOSIS — G43909 Migraine, unspecified, not intractable, without status migrainosus: Secondary | ICD-10-CM | POA: Diagnosis not present

## 2019-02-14 DIAGNOSIS — E663 Overweight: Secondary | ICD-10-CM | POA: Diagnosis not present

## 2019-02-14 DIAGNOSIS — M1990 Unspecified osteoarthritis, unspecified site: Secondary | ICD-10-CM | POA: Diagnosis not present

## 2019-02-14 DIAGNOSIS — I10 Essential (primary) hypertension: Secondary | ICD-10-CM | POA: Diagnosis not present

## 2019-02-14 DIAGNOSIS — L719 Rosacea, unspecified: Secondary | ICD-10-CM | POA: Diagnosis not present

## 2019-02-14 DIAGNOSIS — N6022 Fibroadenosis of left breast: Secondary | ICD-10-CM | POA: Diagnosis not present

## 2019-02-14 DIAGNOSIS — Z0000 Encounter for general adult medical examination without abnormal findings: Secondary | ICD-10-CM | POA: Diagnosis not present

## 2019-02-14 DIAGNOSIS — E1169 Type 2 diabetes mellitus with other specified complication: Secondary | ICD-10-CM | POA: Diagnosis not present

## 2019-03-14 DIAGNOSIS — L7211 Pilar cyst: Secondary | ICD-10-CM | POA: Diagnosis not present

## 2019-05-22 DIAGNOSIS — F329 Major depressive disorder, single episode, unspecified: Secondary | ICD-10-CM | POA: Diagnosis not present

## 2019-05-22 DIAGNOSIS — I10 Essential (primary) hypertension: Secondary | ICD-10-CM | POA: Diagnosis not present

## 2019-05-22 DIAGNOSIS — M1990 Unspecified osteoarthritis, unspecified site: Secondary | ICD-10-CM | POA: Diagnosis not present

## 2019-05-22 DIAGNOSIS — M79671 Pain in right foot: Secondary | ICD-10-CM | POA: Diagnosis not present

## 2019-05-22 DIAGNOSIS — E663 Overweight: Secondary | ICD-10-CM | POA: Diagnosis not present

## 2019-05-22 DIAGNOSIS — E785 Hyperlipidemia, unspecified: Secondary | ICD-10-CM | POA: Diagnosis not present

## 2019-05-22 DIAGNOSIS — G43909 Migraine, unspecified, not intractable, without status migrainosus: Secondary | ICD-10-CM | POA: Diagnosis not present

## 2019-05-22 DIAGNOSIS — N6022 Fibroadenosis of left breast: Secondary | ICD-10-CM | POA: Diagnosis not present

## 2019-05-22 DIAGNOSIS — L719 Rosacea, unspecified: Secondary | ICD-10-CM | POA: Diagnosis not present

## 2019-05-22 DIAGNOSIS — E1169 Type 2 diabetes mellitus with other specified complication: Secondary | ICD-10-CM | POA: Diagnosis not present

## 2019-05-23 DIAGNOSIS — Z23 Encounter for immunization: Secondary | ICD-10-CM | POA: Diagnosis not present

## 2019-05-23 DIAGNOSIS — E7849 Other hyperlipidemia: Secondary | ICD-10-CM | POA: Diagnosis not present

## 2019-05-23 DIAGNOSIS — E1169 Type 2 diabetes mellitus with other specified complication: Secondary | ICD-10-CM | POA: Diagnosis not present

## 2019-06-18 DIAGNOSIS — H353131 Nonexudative age-related macular degeneration, bilateral, early dry stage: Secondary | ICD-10-CM | POA: Diagnosis not present

## 2019-06-18 DIAGNOSIS — D23111 Other benign neoplasm of skin of right upper eyelid, including canthus: Secondary | ICD-10-CM | POA: Diagnosis not present

## 2019-06-18 DIAGNOSIS — H2513 Age-related nuclear cataract, bilateral: Secondary | ICD-10-CM | POA: Diagnosis not present

## 2019-06-18 DIAGNOSIS — H04123 Dry eye syndrome of bilateral lacrimal glands: Secondary | ICD-10-CM | POA: Diagnosis not present

## 2019-06-28 DIAGNOSIS — E1169 Type 2 diabetes mellitus with other specified complication: Secondary | ICD-10-CM | POA: Diagnosis not present

## 2019-06-28 DIAGNOSIS — I10 Essential (primary) hypertension: Secondary | ICD-10-CM | POA: Diagnosis not present

## 2019-12-10 DIAGNOSIS — M25512 Pain in left shoulder: Secondary | ICD-10-CM | POA: Diagnosis not present

## 2019-12-10 DIAGNOSIS — M25511 Pain in right shoulder: Secondary | ICD-10-CM | POA: Diagnosis not present

## 2020-01-21 DIAGNOSIS — J209 Acute bronchitis, unspecified: Secondary | ICD-10-CM | POA: Diagnosis not present

## 2020-01-21 DIAGNOSIS — E1169 Type 2 diabetes mellitus with other specified complication: Secondary | ICD-10-CM | POA: Diagnosis not present

## 2020-02-12 DIAGNOSIS — Z1231 Encounter for screening mammogram for malignant neoplasm of breast: Secondary | ICD-10-CM | POA: Diagnosis not present

## 2020-03-05 DIAGNOSIS — E7849 Other hyperlipidemia: Secondary | ICD-10-CM | POA: Diagnosis not present

## 2020-03-05 DIAGNOSIS — E1169 Type 2 diabetes mellitus with other specified complication: Secondary | ICD-10-CM | POA: Diagnosis not present

## 2020-03-11 DIAGNOSIS — R82998 Other abnormal findings in urine: Secondary | ICD-10-CM | POA: Diagnosis not present

## 2020-03-11 DIAGNOSIS — I10 Essential (primary) hypertension: Secondary | ICD-10-CM | POA: Diagnosis not present

## 2020-03-11 DIAGNOSIS — L719 Rosacea, unspecified: Secondary | ICD-10-CM | POA: Diagnosis not present

## 2020-03-11 DIAGNOSIS — E785 Hyperlipidemia, unspecified: Secondary | ICD-10-CM | POA: Diagnosis not present

## 2020-03-11 DIAGNOSIS — M1990 Unspecified osteoarthritis, unspecified site: Secondary | ICD-10-CM | POA: Diagnosis not present

## 2020-03-11 DIAGNOSIS — E663 Overweight: Secondary | ICD-10-CM | POA: Diagnosis not present

## 2020-03-11 DIAGNOSIS — F329 Major depressive disorder, single episode, unspecified: Secondary | ICD-10-CM | POA: Diagnosis not present

## 2020-03-11 DIAGNOSIS — E1169 Type 2 diabetes mellitus with other specified complication: Secondary | ICD-10-CM | POA: Diagnosis not present

## 2020-03-11 DIAGNOSIS — Z1331 Encounter for screening for depression: Secondary | ICD-10-CM | POA: Diagnosis not present

## 2020-03-11 DIAGNOSIS — Z0000 Encounter for general adult medical examination without abnormal findings: Secondary | ICD-10-CM | POA: Diagnosis not present

## 2020-03-11 DIAGNOSIS — G43909 Migraine, unspecified, not intractable, without status migrainosus: Secondary | ICD-10-CM | POA: Diagnosis not present

## 2020-03-19 DIAGNOSIS — Z1212 Encounter for screening for malignant neoplasm of rectum: Secondary | ICD-10-CM | POA: Diagnosis not present

## 2020-05-12 DIAGNOSIS — J40 Bronchitis, not specified as acute or chronic: Secondary | ICD-10-CM | POA: Diagnosis not present

## 2020-05-12 DIAGNOSIS — Z1152 Encounter for screening for COVID-19: Secondary | ICD-10-CM | POA: Diagnosis not present

## 2020-05-12 DIAGNOSIS — I10 Essential (primary) hypertension: Secondary | ICD-10-CM | POA: Diagnosis not present

## 2020-05-12 DIAGNOSIS — R05 Cough: Secondary | ICD-10-CM | POA: Diagnosis not present

## 2020-05-12 DIAGNOSIS — J189 Pneumonia, unspecified organism: Secondary | ICD-10-CM | POA: Diagnosis not present

## 2020-06-03 DIAGNOSIS — N6452 Nipple discharge: Secondary | ICD-10-CM | POA: Diagnosis not present

## 2020-06-03 DIAGNOSIS — J209 Acute bronchitis, unspecified: Secondary | ICD-10-CM | POA: Diagnosis not present

## 2020-06-03 DIAGNOSIS — N644 Mastodynia: Secondary | ICD-10-CM | POA: Diagnosis not present

## 2020-06-10 DIAGNOSIS — M545 Low back pain, unspecified: Secondary | ICD-10-CM | POA: Diagnosis not present

## 2020-06-17 DIAGNOSIS — R928 Other abnormal and inconclusive findings on diagnostic imaging of breast: Secondary | ICD-10-CM | POA: Diagnosis not present

## 2020-06-17 DIAGNOSIS — N6452 Nipple discharge: Secondary | ICD-10-CM | POA: Diagnosis not present

## 2020-06-22 DIAGNOSIS — I10 Essential (primary) hypertension: Secondary | ICD-10-CM | POA: Diagnosis not present

## 2020-06-22 DIAGNOSIS — G43909 Migraine, unspecified, not intractable, without status migrainosus: Secondary | ICD-10-CM | POA: Diagnosis not present

## 2020-06-22 DIAGNOSIS — E1165 Type 2 diabetes mellitus with hyperglycemia: Secondary | ICD-10-CM | POA: Diagnosis not present

## 2020-06-22 DIAGNOSIS — E663 Overweight: Secondary | ICD-10-CM | POA: Diagnosis not present

## 2020-06-25 DIAGNOSIS — Z23 Encounter for immunization: Secondary | ICD-10-CM | POA: Diagnosis not present

## 2020-06-30 DIAGNOSIS — H04123 Dry eye syndrome of bilateral lacrimal glands: Secondary | ICD-10-CM | POA: Diagnosis not present

## 2020-06-30 DIAGNOSIS — H2513 Age-related nuclear cataract, bilateral: Secondary | ICD-10-CM | POA: Diagnosis not present

## 2020-06-30 DIAGNOSIS — H524 Presbyopia: Secondary | ICD-10-CM | POA: Diagnosis not present

## 2020-06-30 DIAGNOSIS — H5203 Hypermetropia, bilateral: Secondary | ICD-10-CM | POA: Diagnosis not present

## 2020-06-30 DIAGNOSIS — E119 Type 2 diabetes mellitus without complications: Secondary | ICD-10-CM | POA: Diagnosis not present

## 2020-06-30 DIAGNOSIS — M25551 Pain in right hip: Secondary | ICD-10-CM | POA: Diagnosis not present

## 2020-06-30 DIAGNOSIS — D23111 Other benign neoplasm of skin of right upper eyelid, including canthus: Secondary | ICD-10-CM | POA: Diagnosis not present

## 2020-06-30 DIAGNOSIS — M545 Low back pain, unspecified: Secondary | ICD-10-CM | POA: Diagnosis not present

## 2020-06-30 DIAGNOSIS — H10413 Chronic giant papillary conjunctivitis, bilateral: Secondary | ICD-10-CM | POA: Diagnosis not present

## 2020-06-30 DIAGNOSIS — H353131 Nonexudative age-related macular degeneration, bilateral, early dry stage: Secondary | ICD-10-CM | POA: Diagnosis not present

## 2020-07-15 DIAGNOSIS — M25562 Pain in left knee: Secondary | ICD-10-CM | POA: Diagnosis not present

## 2020-07-24 DIAGNOSIS — M25562 Pain in left knee: Secondary | ICD-10-CM | POA: Diagnosis not present

## 2020-08-19 DIAGNOSIS — S83232A Complex tear of medial meniscus, current injury, left knee, initial encounter: Secondary | ICD-10-CM | POA: Diagnosis not present

## 2020-08-19 DIAGNOSIS — M659 Synovitis and tenosynovitis, unspecified: Secondary | ICD-10-CM | POA: Diagnosis not present

## 2020-08-19 DIAGNOSIS — Y999 Unspecified external cause status: Secondary | ICD-10-CM | POA: Diagnosis not present

## 2020-08-19 DIAGNOSIS — M23062 Cystic meniscus, other lateral meniscus, left knee: Secondary | ICD-10-CM | POA: Diagnosis not present

## 2020-08-19 DIAGNOSIS — G8918 Other acute postprocedural pain: Secondary | ICD-10-CM | POA: Diagnosis not present

## 2020-08-19 DIAGNOSIS — S83272A Complex tear of lateral meniscus, current injury, left knee, initial encounter: Secondary | ICD-10-CM | POA: Diagnosis not present

## 2020-08-19 DIAGNOSIS — M94262 Chondromalacia, left knee: Secondary | ICD-10-CM | POA: Diagnosis not present

## 2020-08-19 DIAGNOSIS — X58XXXA Exposure to other specified factors, initial encounter: Secondary | ICD-10-CM | POA: Diagnosis not present

## 2020-09-10 DIAGNOSIS — M25511 Pain in right shoulder: Secondary | ICD-10-CM | POA: Diagnosis not present

## 2020-09-10 DIAGNOSIS — M25512 Pain in left shoulder: Secondary | ICD-10-CM | POA: Diagnosis not present

## 2020-09-14 DIAGNOSIS — D23111 Other benign neoplasm of skin of right upper eyelid, including canthus: Secondary | ICD-10-CM | POA: Diagnosis not present

## 2020-09-14 DIAGNOSIS — H04123 Dry eye syndrome of bilateral lacrimal glands: Secondary | ICD-10-CM | POA: Diagnosis not present

## 2020-09-24 DIAGNOSIS — M25511 Pain in right shoulder: Secondary | ICD-10-CM | POA: Diagnosis not present

## 2020-09-29 DIAGNOSIS — M25512 Pain in left shoulder: Secondary | ICD-10-CM | POA: Diagnosis not present

## 2020-09-29 DIAGNOSIS — E119 Type 2 diabetes mellitus without complications: Secondary | ICD-10-CM | POA: Diagnosis not present

## 2020-09-29 DIAGNOSIS — M75101 Unspecified rotator cuff tear or rupture of right shoulder, not specified as traumatic: Secondary | ICD-10-CM | POA: Diagnosis not present

## 2020-09-29 DIAGNOSIS — M542 Cervicalgia: Secondary | ICD-10-CM | POA: Diagnosis not present

## 2020-09-30 DIAGNOSIS — M25512 Pain in left shoulder: Secondary | ICD-10-CM | POA: Diagnosis not present

## 2020-10-05 DIAGNOSIS — I10 Essential (primary) hypertension: Secondary | ICD-10-CM | POA: Diagnosis not present

## 2020-10-05 DIAGNOSIS — M1990 Unspecified osteoarthritis, unspecified site: Secondary | ICD-10-CM | POA: Diagnosis not present

## 2020-10-05 DIAGNOSIS — E785 Hyperlipidemia, unspecified: Secondary | ICD-10-CM | POA: Diagnosis not present

## 2020-10-05 DIAGNOSIS — E1169 Type 2 diabetes mellitus with other specified complication: Secondary | ICD-10-CM | POA: Diagnosis not present

## 2020-10-06 DIAGNOSIS — M4313 Spondylolisthesis, cervicothoracic region: Secondary | ICD-10-CM | POA: Diagnosis not present

## 2020-10-06 DIAGNOSIS — M5021 Other cervical disc displacement,  high cervical region: Secondary | ICD-10-CM | POA: Diagnosis not present

## 2020-10-06 DIAGNOSIS — M79671 Pain in right foot: Secondary | ICD-10-CM | POA: Diagnosis not present

## 2020-10-06 DIAGNOSIS — E785 Hyperlipidemia, unspecified: Secondary | ICD-10-CM | POA: Diagnosis not present

## 2020-10-06 DIAGNOSIS — M1990 Unspecified osteoarthritis, unspecified site: Secondary | ICD-10-CM | POA: Diagnosis not present

## 2020-10-06 DIAGNOSIS — M353 Polymyalgia rheumatica: Secondary | ICD-10-CM | POA: Diagnosis not present

## 2020-10-06 DIAGNOSIS — M4802 Spinal stenosis, cervical region: Secondary | ICD-10-CM | POA: Diagnosis not present

## 2020-10-14 ENCOUNTER — Other Ambulatory Visit: Payer: Self-pay

## 2020-10-14 ENCOUNTER — Encounter: Payer: Self-pay | Admitting: Orthopaedic Surgery

## 2020-10-14 ENCOUNTER — Ambulatory Visit: Payer: PPO | Admitting: Orthopaedic Surgery

## 2020-10-14 DIAGNOSIS — M25511 Pain in right shoulder: Secondary | ICD-10-CM | POA: Diagnosis not present

## 2020-10-14 DIAGNOSIS — M25512 Pain in left shoulder: Secondary | ICD-10-CM | POA: Diagnosis not present

## 2020-10-14 NOTE — Progress Notes (Signed)
Office Visit Note   Patient: Shawna Payne           Date of Birth: [birth year]           MRN: [id number] Visit Date: 10/14/2020              Requested by: Burnard Bunting, MD [address] [place],  [place] [address] PCP: Burnard Bunting, MD   Assessment & Plan: Visit Diagnoses:  1. Acute pain of both shoulders     Plan: Shawna Payne is accompanied by her husband and seen as another opinion regarding a problem with bilateral shoulder pain.  Shawna Payne underwent a left knee arthroscopy by Dr. Alma Friendly on 5 January.  She is done well with that.  Several days after the procedure she developed onset of right and 6, subsequently, left shoulder pain.  There was no obvious injury or trauma or difficulty during the surgery.  Dr. Alma Friendly attempted a intra-articular cortisone injection on the right as it was the more symptomatic but did not really make much of a difference.  He, subsequently, we will obtain an MRI scan of both the right and the left shoulder because of her persistent pain.  The right shoulder MRI scan revealed moderate glenohumeral osteoarthritis with a mild to moderate effusion and severe attritional supraspinatus tendinosis with a high-grade ill-defined tear.  She had moderate infraspinatus and subscapularis tendinosis and moderate biceps long head tendinosis.  About a week later he obtained an MRI scan of her left shoulder that revealed moderate glenohumeral osteoarthritis with mild to moderate effusion and bursitis.  There was moderate infraspinatus and subscapularis and long biceps tendinosis and a partial bursal sided supraspinatus tendon tear.  There was evidence of impingement.  Dr. Alma Friendly tried several medicines that really work very helpful.  She subsequently had a virtual appointment with Dr. Reynaldo Minium, her primary care physician.  He obtained an MRI scan of her cervical spine.  I do not have the results but the interpretation according to Shawna Payne was minimal degenerative arthritis.   There was no obvious cause on the MRI scan related to her shoulder pain.  Dr. Reynaldo Minium started a course of prednisone about 6 days ago when she has had significant relief of her pain.  She is diabetic presently taking Jardiance and Metformin. Long discussion over 45 minutes regarding all of the above and evaluating her tests.    I agree with Dr. Reynaldo Minium that the prednisone is the best course of treatment.  She has another 6 days to complete the course.  If she continues to have a problem I would suggest that he continue the prednisone and then obtain a CBC and a sed rate.  There are number of diagnostic possibilities including fibromyalgia(remote history by rheumatologist in the past) and even polymyalgia rheumatica, possibly, reflex sympathetic dystrophy.  She is much better with the prednisone.  I have encouraged her to continue with passive and active range of motion to prevent adhesive capsulitis.  There is no evidence of neurologic deficit and do not think that she would require EMGs and nerve conduction studies.  Obviously, Dr. Reynaldo Minium is monitoring her blood glucose with the prednisone. We need to be careful that she does not develop adhesive capsulitis and ,thus, the reason for maintaining her range of motion.  Consider physical therapy should she begin to get "stiff".  This would not be uncommon in a diabetic.  I would be more than happy to reevaluate over the next 7 to 10 days  if there is no improvement.  Follow-Up Instructions: Return if symptoms worsen or fail to improve.   Orders:  No orders of the defined types were placed in this encounter.  No orders of the defined types were placed in this encounter.     Procedures: No procedures performed   Clinical Data: No additional findings.   Subjective: Chief Complaint  Patient presents with  . Right Shoulder - Pain  . Left Shoulder - Pain  Patient presents today for bilateral shoulder pain. She states that she had surgery on 08/15/2020  on her knee. She woke with pain in both shoulders. She developed weakness in both arms and decreased range of motion. She said that she lost the ability to use either arm. The right side worse than the left. She has had an MRI of both shoulders and her neck. She saw her PCP and had labwork and started a prednisone pack. She is now on day 6 of the prednisone and has noticed some improvement. She is very discouraged and here for a second opinion. No neck pain. She is right hand dominant. Patient brought copies of her MRIs Copies of records from Dr. Alma Friendly are included including MRI scan reports of both right and left shoulder.  She also has lab work performed by Dr. Jacquiline Doe office demonstrating minimal elevation of white blood cell count just over 11,000 and a slight shift in neutrophils Only not having any numbness or tingling in either upper extremity.  Does feel little bit weak probably because of disuse.  No longer having any problem with her knee post arthroscopy.  Was not experiencing any particular problem with either shoulder prior to the knee arthroscopy.  No related problems related to her shoulders during her anesthesia. I did review films of both right and left shoulder on a disc that was provided by the patient.  Joint spaces are well-maintained but there is a small inferior humeral head spur on both the right and left.  No ectopic calcification   HPI  Review of Systems   Objective: Vital Signs: Ht 5\' 7"  (1.702 m)   Wt 153 lb (69.4 kg)   BMI 23.96 kg/m   Physical Exam Constitutional:      Appearance: She is well-developed and well-nourished.  HENT:     Mouth/Throat:     Mouth: Oropharynx is clear and moist.  Eyes:     Extraocular Movements: EOM normal.     Pupils: Pupils are equal, round, and reactive to light.  Pulmonary:     Effort: Pulmonary effort is normal.  Skin:    General: Skin is warm and dry.  Neurological:     Mental Status: She is alert and oriented to person,  place, and time.  Psychiatric:        Mood and Affect: Mood and affect normal.        Behavior: Behavior normal.     Ortho Exam awake alert and oriented x3.  Comfortable sitting and in no obvious distress.  I was able to passively place the left shoulder fully overhead and abduct at least 100 degrees without much trouble.  Minimally positive impingement testing and no significant loss of motion.  Good grip and release.  There were some degenerative changes at the base of the thumb.  Skin intact.  Right shoulder was a little bit stiffer than the right I could abduct at least 90 degrees and place her right arm almost fully overhead lacking just a few degrees to full motion.  Slight loss of external rotation compared to the left.  There is a good grip and release a little were some degenerative changes in her hand.  Full range of motion of cervical spine with negative L'Hermitte's.  Specialty Comments:  No specialty comments available.  Imaging: No results found.   PMFS History: Patient Active Problem List   Diagnosis Date Noted  . Bilateral shoulder pain 10/14/2020  . Radial scar of breast 08/03/2016  . Ductal hyperplasia of breast 08/03/2016  . At high risk for breast cancer 08/03/2016   Past Medical History:  Diagnosis Date  . Allergy   . Breast mass, left   . Complication of anesthesia   . Diabetes mellitus without complication ([facility])   . PONV (postoperative nausea and vomiting)   . Seasonal allergies     Family History  Problem Relation Age of Onset  . Colon cancer Neg Hx   . Esophageal cancer Neg Hx   . Rectal cancer Neg Hx   . Stomach cancer Neg Hx     Past Surgical History:  Procedure Laterality Date  . BREAST LUMPECTOMY WITH RADIOACTIVE SEED LOCALIZATION Left 06/30/2016   Procedure: LEFT BREAST LUMPECTOMY WITH RADIOACTIVE SEED LOCALIZATION;  Surgeon: Autumn Messing III, MD;  Location: [facility];  Service: General;  Laterality: Left;  . BREAST SURGERY Left     papilloma  . CHOLECYSTECTOMY    . KNEE ARTHROSCOPY Left   . TONSILLECTOMY     Social History   Occupational History  . Not on file  Tobacco Use  . Smoking status: Former Research scientist (life sciences)  . Smokeless tobacco: Never Used  . Tobacco comment: Quit 50 years ago.   Substance and Sexual Activity  . Alcohol use: No  . Drug use: No  . Sexual activity: Not on file

## 2020-10-23 ENCOUNTER — Telehealth: Payer: Self-pay | Admitting: Orthopaedic Surgery

## 2020-10-23 NOTE — Telephone Encounter (Signed)
10/14/20 ov note faxed to Dr. Reynaldo Minium [phone]

## 2020-11-03 DIAGNOSIS — R531 Weakness: Secondary | ICD-10-CM | POA: Diagnosis not present

## 2020-11-03 DIAGNOSIS — E1169 Type 2 diabetes mellitus with other specified complication: Secondary | ICD-10-CM | POA: Diagnosis not present

## 2020-11-03 DIAGNOSIS — R000 Tachycardia, unspecified: Secondary | ICD-10-CM | POA: Diagnosis not present

## 2020-11-03 DIAGNOSIS — M353 Polymyalgia rheumatica: Secondary | ICD-10-CM | POA: Diagnosis not present

## 2020-11-03 DIAGNOSIS — I10 Essential (primary) hypertension: Secondary | ICD-10-CM | POA: Diagnosis not present

## 2020-11-23 DIAGNOSIS — M353 Polymyalgia rheumatica: Secondary | ICD-10-CM | POA: Diagnosis not present

## 2020-11-23 DIAGNOSIS — R29898 Other symptoms and signs involving the musculoskeletal system: Secondary | ICD-10-CM | POA: Diagnosis not present

## 2020-11-23 DIAGNOSIS — R269 Unspecified abnormalities of gait and mobility: Secondary | ICD-10-CM | POA: Diagnosis not present

## 2020-11-23 DIAGNOSIS — E1169 Type 2 diabetes mellitus with other specified complication: Secondary | ICD-10-CM | POA: Diagnosis not present

## 2020-11-23 DIAGNOSIS — I10 Essential (primary) hypertension: Secondary | ICD-10-CM | POA: Diagnosis not present

## 2020-11-25 DIAGNOSIS — I10 Essential (primary) hypertension: Secondary | ICD-10-CM | POA: Diagnosis not present

## 2020-11-25 DIAGNOSIS — E1169 Type 2 diabetes mellitus with other specified complication: Secondary | ICD-10-CM | POA: Diagnosis not present

## 2020-11-25 DIAGNOSIS — E785 Hyperlipidemia, unspecified: Secondary | ICD-10-CM | POA: Diagnosis not present

## 2020-11-25 DIAGNOSIS — M353 Polymyalgia rheumatica: Secondary | ICD-10-CM | POA: Diagnosis not present

## 2020-12-09 ENCOUNTER — Ambulatory Visit: Payer: PPO | Attending: Internal Medicine | Admitting: Occupational Therapy

## 2020-12-09 ENCOUNTER — Encounter: Payer: Self-pay | Admitting: Occupational Therapy

## 2020-12-09 ENCOUNTER — Other Ambulatory Visit: Payer: Self-pay

## 2020-12-09 DIAGNOSIS — M791 Myalgia, unspecified site: Secondary | ICD-10-CM | POA: Diagnosis not present

## 2020-12-09 DIAGNOSIS — M25512 Pain in left shoulder: Secondary | ICD-10-CM | POA: Diagnosis not present

## 2020-12-09 DIAGNOSIS — M79642 Pain in left hand: Secondary | ICD-10-CM | POA: Diagnosis not present

## 2020-12-09 DIAGNOSIS — M79641 Pain in right hand: Secondary | ICD-10-CM | POA: Insufficient documentation

## 2020-12-09 DIAGNOSIS — M25511 Pain in right shoulder: Secondary | ICD-10-CM | POA: Diagnosis not present

## 2020-12-09 DIAGNOSIS — M6281 Muscle weakness (generalized): Secondary | ICD-10-CM | POA: Diagnosis not present

## 2020-12-09 NOTE — Patient Instructions (Addendum)
Polymyalgia Rheumatica Polymyalgia rheumatica (PMR) is an inflammatory disorder that causes the muscles and joints to ache and become stiff. Sometimes, PMR leads to a more dangerous condition that can cause vision loss (temporal arteritis or giant cell arteritis). What are the causes? The exact cause of PMR is not known. What increases the risk? You are more likely to develop this condition if you are:  Female.  69 years of age or older.  Caucasian. What are the signs or symptoms? Pain and stiffness are the main symptoms of PMR. Symptoms may:  Be worse after inactivity and in the morning.  Affect your: ? Hips, buttocks, and thighs. ? Neck, arms, and shoulders. This can make it hard to raise your arms above your head. ? Hands and wrists. Other symptoms include:  Fever.  Tiredness.  Weakness.  Depression.  Decreased appetite. This may lead to weight loss. Symptoms may start slowly or suddenly. How is this diagnosed? This condition is diagnosed with your medical history and a physical exam. You may need to see a health care provider who specializes in diseases of the joints, muscles, and bones (rheumatologist). You may also have tests, including:  Blood tests.  X-rays.  Ultrasound.   How is this treated? PMR usually goes away without treatment, but it may take years. Your health care provider may recommend low-dose steroids and other medicines to help manage your symptoms of pain and stiffness. Regular exercise and rest will also help your symptoms. Follow these instructions at home:  Take over-the-counter and prescription medicines only as told by your health care provider.  Make sure to get enough rest and sleep.  Eat a healthy and nutritious diet.  Try to exercise most days of the week. Ask your health care provider what type of exercise is best for you.  Keep all follow-up visits as told by your health care provider. This is important.   Contact a health care  provider if:  Your symptoms do not improve with medicine.  You have side effects from steroids. These may include: ? Weight gain. ? Swelling. ? Insomnia. ? Mood changes. ? Bruising. ? High blood sugar readings, if you have diabetes. ? Higher than normal blood pressure readings, if you monitor your blood pressure. Get help right away if:  You develop symptoms of temporal arteritis, such as: ? A change in vision. ? Severe headache. ? Scalp pain. ? Jaw pain. Summary  Polymyalgia rheumatica is an inflammatory disorder that causes aching and stiffness in your muscles and joints.  The exact cause of this condition is not known.  This condition usually goes away without treatment. Your health care provider may give you low-dose steroids to help manage your pain and stiffness.  Rest and regular exercise will help the symptoms. This information is not intended to replace advice given to you by your health care provider. Make sure you discuss any questions you have with your health care provider. Document Revised: 06/07/2018 Document Reviewed: 06/07/2018 Elsevier Patient Education  2021 Elsevier Inc.  

## 2020-12-10 NOTE — Therapy (Signed)
[facility] [address] [place], [place], [address] Phone: [phone]   Fax:  [phone]  Occupational Therapy Evaluation  Patient Details  Name: Shawna Payne MRN: [id number] Date of Birth: 24-Sep-1950 Referring Provider (OT): Burnard Bunting, MD   Encounter Date: 12/09/2020   OT End of Session - 12/09/20 1324    Visit Number 1    Number of Visits 9    Date for OT Re-Evaluation 02/03/21    Authorization Type HTA [id number]    Authorization Time Period $30 copay [id number]    OT Start Time 1638    OT Stop Time 1359    OT Time Calculation (min) 42 min    Activity Tolerance Patient tolerated treatment well;Patient limited by pain    Behavior During Therapy Anxious;Flat affect           Past Medical History:  Diagnosis Date  . Allergy   . Breast mass, left   . Complication of anesthesia   . Diabetes mellitus without complication ([facility])   . PONV (postoperative nausea and vomiting)   . Seasonal allergies     Past Surgical History:  Procedure Laterality Date  . BREAST LUMPECTOMY WITH RADIOACTIVE SEED LOCALIZATION Left 06/30/2016   Procedure: LEFT BREAST LUMPECTOMY WITH RADIOACTIVE SEED LOCALIZATION;  Surgeon: Autumn Messing III, MD;  Location: [facility];  Service: General;  Laterality: Left;  . BREAST SURGERY Left    papilloma  . CHOLECYSTECTOMY    . KNEE ARTHROSCOPY Left   . TONSILLECTOMY      There were no vitals filed for this visit.   Subjective Assessment - 12/09/20 1321    Subjective  Pt presents with bilateral UE pain from shoulders and distally to hands. Pt reports pain is worse in right > left. Pt's primary goal is to "get rid of the pain in hands or make it more manageable"    Pertinent History DM, BUE RTC tears (non surgical)    Patient Stated Goals "get rid of the pain in hands or make it more manageable"    Currently in Pain? Yes    Pain Score 4     Pain Location Hand    Pain Orientation  Left;Right   R>L   Pain Descriptors / Indicators Aching    Pain Type Neuropathic pain;Acute pain    Pain Onset More than a month ago    Pain Frequency Constant    Aggravating Factors  overuse makes it worse next day    Pain Relieving Factors Tylenol             [facility] OT Assessment - 12/09/20 1325      Assessment   Medical Diagnosis bilateral shoulder pain    Referring Provider (OT) Burnard Bunting, MD    Onset Date/Surgical Date 08/15/20   pain started in Jan 2022   Hand Dominance Right      Precautions   Precautions None    Required Braces or Orthoses Other Brace/Splint    Other Brace/Splint no braces currently      Balance Screen   Has the patient fallen in the past 6 months No      Home  Environment   Family/patient expects to be discharged to: Private residence    Living Arrangements Spouse/significant other    Available Help at Discharge Family    Type of [place]      Prior Function   Level of Independence Independent  Vocation Retired    Leisure travel      ADL   Eating/Feeding Needs assist with cutting food    Grooming Modified independent    Upper Body Bathing Modified independent    Lower Body Bathing Modified independent    Upper Body Dressing Needs assist for fasteners   has help for bra   Lower Body Dressing Needs assist for fasteners   if it's in the back needs assistance   Toilet Transfer Modified independent    [place] independent    [place] Transfer Modified independent      IADL   [place] care of all shopping needs independently    Light Housekeeping [place] small items, rinses stockings, etc.   does not do any cleaning but does laundry   Meal Prep Plans, prepares and serves adequate meals independently   has help with heavy lifting etc   Community Mobility Drives own vehicle    Medication Management Is  responsible for taking medication in correct dosages at correct time    Physiological scientist financial matters independently (budgets, writes checks, pays rent, bills goes to bank), collects and keeps track of income      Written Expression   Dominant Hand Right    Handwriting 90% legible   very shaky at times     Vision - History   Baseline Vision Wears glasses all the time      Vision Assessment   Comment pt denies any changes      Sensation   Light Touch Appears Intact    Hot/Cold Appears Intact      Coordination   9 Hole Peg Test Right;Left    Right 9 Hole Peg Test 23.56    Left 9 Hole Peg Test 25.35    Box and Blocks R - 49 L - 49      Hand Function   Right Hand Gross Grasp Impaired   pain   Right Hand Grip (lbs) 14.9    Left Hand Gross Grasp Impaired   pain   Left Hand Grip (lbs) 22.7                           OT Education - 12/10/20 0951    Education Details Education provided on role and purpose of OT. Table slides for shoulder joint mobility.    Person(s) Educated Patient    Methods Explanation;Demonstration    Comprehension Verbalized understanding;Returned demonstration            OT Short Term Goals - 12/09/20 1528      OT SHORT TERM GOAL #1   Title Pt will be independent with HEP    Time 4    Period Weeks    Status New    Target Date 01/06/21      OT SHORT TERM GOAL #2   Title Pt will verbalize understanding of pain mangemneet strategies.    Time 4    Period Weeks    Status New      OT SHORT TERM GOAL #3   Title Pt will verbalize understanding and demonstrate compliance with splint and/or brace wear and care PRN    Time 4    Period Weeks    Status New      OT SHORT TERM GOAL #4   Title Pt will demonstrate improved grip strength in RUE by 5 lbs or greater.  Baseline R 14.9  L 22.7    Time 4    Period Weeks    Status New             OT Long Term Goals - 12/09/20 1529      OT LONG TERM GOAL #1   Title Pt  will verbalize understanding of adapted strategies for increasing independence and comfort with manipulation of fasteners, clasping bra, opening containers, etc.    Time 8    Period Weeks    Status New    Target Date 02/03/21      OT LONG TERM GOAL #2   Title Pt will verbalize understanding of energy conservation strategies    Time 8    Period Weeks    Status New      OT LONG TERM GOAL #3   Title Pt will demonstrate improved grip strength, bilaterally, of 25 lbs or greater.    Baseline R 14.9 L 22.7    Time 8    Period Weeks    Status New      OT LONG TERM GOAL #4   Title Pt will verbalize understanding of joint protection strategies    Time 8    Period Weeks    Status New                 Plan - 12/09/20 1354    Clinical Impression Statement Pt is a 70 year old that presents to Neuro OPOT s/p diagnosis of polymyalgia with bilateral UE pain starting in shoulders, across chest and currently in bilateral hands with right worse than left. PMH significant for diabetes. Pt reports the pain began after knee surgery. Pt presents with deficits in overall pain, decreased grip strength and difficulty with opening containers, clasping objects, etc d/t pain and weakness. Pt would benefit from occupational therapy to target listed areas of deficits and provide education on strategies and management of pain, protection and preventing further injury and discomfort.    OT Occupational Profile and History Problem Focused Assessment - Including review of records relating to presenting problem    Occupational performance deficits (Please refer to evaluation for details): IADL's;ADL's;Rest and Sleep;Leisure    Marketing executive / Function / Physical Skills ADL;IADL;UE functional use;Strength;Decreased knowledge of use of DME;FMC;Pain    Rehab Potential Fair    Clinical Decision Making Limited treatment options, no task modification necessary    Comorbidities Affecting Occupational Performance: None     Modification or Assistance to Complete Evaluation  No modification of tasks or assist necessary to complete eval    OT Frequency 1x / week    OT Duration 8 weeks    OT Treatment/Interventions Self-care/ADL training;Electrical Stimulation;Ultrasound;Moist Heat;Fluidtherapy;Therapeutic exercise;Energy conservation;Manual Therapy;Patient/family education;Therapeutic activities;DME and/or AE instruction    Plan joint protection strategies, pain management strategies, maybe fluido or moist heat?    OT Home Exercise Plan instructed on table slides for increasing joint mobility and preventing frozen shoulders    Consulted and Agree with Plan of Care Patient           Patient will benefit from skilled therapeutic intervention in order to improve the following deficits and impairments:   Body Structure / Function / Physical Skills: ADL,IADL,UE functional use,Strength,Decreased knowledge of use of DME,FMC,Pain       Visit Diagnosis: Acute pain of left shoulder - Plan: Ot plan of care cert/re-cert  Acute pain of right shoulder - Plan: Ot plan of care cert/re-cert  Pain in left hand - Plan: Ot  plan of care cert/re-cert  Pain in right hand - Plan: Ot plan of care cert/re-cert  Muscle weakness (generalized) - Plan: Ot plan of care cert/re-cert  Myalgia - Plan: Ot plan of care cert/re-cert    Problem List Patient Active Problem List   Diagnosis Date Noted  . Bilateral shoulder pain 10/14/2020  . Radial scar of breast 08/03/2016  . Ductal hyperplasia of breast 08/03/2016  . At high risk for breast cancer 08/03/2016    Zachery Conch MOT, OTR/L  12/10/2020, 9:55 AM  [facility] [address] [place], [place], [address] Phone: [phone]   Fax:  [phone]  Name: BREALYNN MCALEXANDER MRN: [id number] Date of Birth: 11/18/1950

## 2020-12-11 DIAGNOSIS — R269 Unspecified abnormalities of gait and mobility: Secondary | ICD-10-CM | POA: Diagnosis not present

## 2020-12-14 ENCOUNTER — Ambulatory Visit: Payer: PPO | Admitting: Occupational Therapy

## 2020-12-15 DIAGNOSIS — R269 Unspecified abnormalities of gait and mobility: Secondary | ICD-10-CM | POA: Diagnosis not present

## 2020-12-17 DIAGNOSIS — R269 Unspecified abnormalities of gait and mobility: Secondary | ICD-10-CM | POA: Diagnosis not present

## 2020-12-21 ENCOUNTER — Other Ambulatory Visit: Payer: Self-pay

## 2020-12-21 ENCOUNTER — Ambulatory Visit: Payer: PPO | Attending: Internal Medicine | Admitting: Occupational Therapy

## 2020-12-21 DIAGNOSIS — M6281 Muscle weakness (generalized): Secondary | ICD-10-CM

## 2020-12-21 DIAGNOSIS — M25511 Pain in right shoulder: Secondary | ICD-10-CM | POA: Diagnosis not present

## 2020-12-21 DIAGNOSIS — M79642 Pain in left hand: Secondary | ICD-10-CM | POA: Diagnosis not present

## 2020-12-21 DIAGNOSIS — M79641 Pain in right hand: Secondary | ICD-10-CM | POA: Insufficient documentation

## 2020-12-21 DIAGNOSIS — M791 Myalgia, unspecified site: Secondary | ICD-10-CM

## 2020-12-21 DIAGNOSIS — M25512 Pain in left shoulder: Secondary | ICD-10-CM | POA: Insufficient documentation

## 2020-12-21 NOTE — Therapy (Signed)
[facility] [address] [place], [place], [address] Phone: [phone]   Fax:  [phone]  Occupational Therapy Treatment  Patient Details  Name: Shawna Payne MRN: [id number] Date of Birth: 05-07-1951 Referring Provider (OT): Burnard Bunting, MD   Encounter Date: 12/21/2020   OT End of Session - 12/21/20 1448    Visit Number 2    Number of Visits 9    Date for OT Re-Evaluation 02/03/21    Authorization Type HTA [id number]    Authorization Time Period $30 copay [id number]    OT Start Time 1446    OT Stop Time 7062   pt going on hold currently   OT Time Calculation (min) 32 min    Activity Tolerance Patient tolerated treatment well;Patient limited by pain    Behavior During Therapy Anxious;Flat affect           Past Medical History:  Diagnosis Date  . Allergy   . Breast mass, left   . Complication of anesthesia   . Diabetes mellitus without complication ([facility])   . PONV (postoperative nausea and vomiting)   . Seasonal allergies     Past Surgical History:  Procedure Laterality Date  . BREAST LUMPECTOMY WITH RADIOACTIVE SEED LOCALIZATION Left 06/30/2016   Procedure: LEFT BREAST LUMPECTOMY WITH RADIOACTIVE SEED LOCALIZATION;  Surgeon: Autumn Messing III, MD;  Location: [facility];  Service: General;  Laterality: Left;  . BREAST SURGERY Left    papilloma  . CHOLECYSTECTOMY    . KNEE ARTHROSCOPY Left   . TONSILLECTOMY      There were no vitals filed for this visit.   Subjective Assessment - 12/21/20 1448    Subjective  Mid afternoon it's better - it was pretty bad this morning.    Pertinent History DM, BUE RTC tears (non surgical)    Patient Stated Goals "get rid of the pain in hands or make it more manageable"    Currently in Pain? Yes    Pain Score 5     Pain Location Hand    Pain Orientation Right    Pain Descriptors / Indicators Aching    Pain Type Neuropathic pain;Acute pain    Pain Onset More  than a month ago    Pain Frequency Constant               Cold Pack applied to BUE hands. Pt unable to tolerated > 45 seconds on RUE hand d/t extreme pain at dorsal and volar sides of wrist.  Table Slides x 10 with emphasis on range of motion and postural muscles for upright sitting and positioning  Education  On joint protection strategies. Issued red foam grip for joint protection.   Hold - pt currently going on hold until further information re: diagnosis is obtained. Will contact office re: continuing therapy or d/c. Cancelled remaining appts.                 OT Short Term Goals - 12/09/20 1528      OT SHORT TERM GOAL #1   Title Pt will be independent with HEP    Time 4    Period Weeks    Status New    Target Date 01/06/21      OT SHORT TERM GOAL #2   Title Pt will verbalize understanding of pain [place] strategies.    Time 4    Period Weeks    Status New      OT SHORT TERM  GOAL #3   Title Pt will verbalize understanding and demonstrate compliance with splint and/or brace wear and care PRN    Time 4    Period Weeks    Status New      OT SHORT TERM GOAL #4   Title Pt will demonstrate improved grip strength in RUE by 5 lbs or greater.    Baseline R 14.9  L 22.7    Time 4    Period Weeks    Status New             OT Long Term Goals - 12/09/20 1529      OT LONG TERM GOAL #1   Title Pt will verbalize understanding of adapted strategies for increasing independence and comfort with manipulation of fasteners, clasping bra, opening containers, etc.    Time 8    Period Weeks    Status New    Target Date 02/03/21      OT LONG TERM GOAL #2   Title Pt will verbalize understanding of energy conservation strategies    Time 8    Period Weeks    Status New      OT LONG TERM GOAL #3   Title Pt will demonstrate improved grip strength, bilaterally, of 25 lbs or greater.    Baseline R 14.9 L 22.7    Time 8    Period Weeks    Status New      OT  LONG TERM GOAL #4   Title Pt will verbalize understanding of joint protection strategies    Time 8    Period Weeks    Status New                 Plan - 12/21/20 1526    Clinical Impression Statement Pt agreed with set goals. Pt going on hold at this time and will resume once more information is obtained [id number] and plan of care. Pt will contact office.    OT Occupational Profile and History Problem Focused Assessment - Including review of records relating to presenting problem    Occupational performance deficits (Please refer to evaluation for details): IADL's;ADL's;Rest and Sleep;Leisure    Marketing executive / Function / Physical Skills ADL;IADL;UE functional use;Strength;Decreased knowledge of use of DME;FMC;Pain    Rehab Potential Fair    Clinical Decision Making Limited treatment options, no task modification necessary    Comorbidities Affecting Occupational Performance: None    Modification or Assistance to Complete Evaluation  No modification of tasks or assist necessary to complete eval    OT Frequency 1x / week    OT Duration 8 weeks    OT Treatment/Interventions Self-care/ADL training;Electrical Stimulation;Ultrasound;Moist Heat;Fluidtherapy;Therapeutic exercise;Energy conservation;Manual Therapy;Patient/family education;Therapeutic activities;DME and/or AE instruction    Plan pt on hold at this time.    OT Home Exercise Plan instructed on table slides for increasing joint mobility and preventing frozen shoulders    Consulted and Agree with Plan of Care Patient           Patient will benefit from skilled therapeutic intervention in order to improve the following deficits and impairments:   Body Structure / Function / Physical Skills: ADL,IADL,UE functional use,Strength,Decreased knowledge of use of DME,FMC,Pain       Visit Diagnosis: Acute pain of left shoulder  Acute pain of right shoulder  Pain in left hand  Pain in right hand  Muscle weakness  (generalized)  Myalgia    Problem List Patient Active Problem List   Diagnosis Date Noted  .  Bilateral shoulder pain 10/14/2020  . Radial scar of breast 08/03/2016  . Ductal hyperplasia of breast 08/03/2016  . At high risk for breast cancer 08/03/2016    Zachery Conch  MOT, OTR/L  12/21/2020, 3:30 PM  [facility] [address] [place], [place], [address] Phone: [phone]   Fax:  [phone]  Name: Shawna Payne MRN: [id number] Date of Birth: 1951-02-12

## 2020-12-22 DIAGNOSIS — R269 Unspecified abnormalities of gait and mobility: Secondary | ICD-10-CM | POA: Diagnosis not present

## 2020-12-28 ENCOUNTER — Encounter: Payer: PPO | Admitting: Occupational Therapy

## 2020-12-29 DIAGNOSIS — R269 Unspecified abnormalities of gait and mobility: Secondary | ICD-10-CM | POA: Diagnosis not present

## 2020-12-30 DIAGNOSIS — R269 Unspecified abnormalities of gait and mobility: Secondary | ICD-10-CM | POA: Diagnosis not present

## 2020-12-31 DIAGNOSIS — E663 Overweight: Secondary | ICD-10-CM | POA: Diagnosis not present

## 2020-12-31 DIAGNOSIS — Z6825 Body mass index (BMI) 25.0-25.9, adult: Secondary | ICD-10-CM | POA: Diagnosis not present

## 2020-12-31 DIAGNOSIS — R5383 Other fatigue: Secondary | ICD-10-CM | POA: Diagnosis not present

## 2020-12-31 DIAGNOSIS — M064 Inflammatory polyarthropathy: Secondary | ICD-10-CM | POA: Diagnosis not present

## 2020-12-31 DIAGNOSIS — M159 Polyosteoarthritis, unspecified: Secondary | ICD-10-CM | POA: Diagnosis not present

## 2020-12-31 DIAGNOSIS — M353 Polymyalgia rheumatica: Secondary | ICD-10-CM | POA: Diagnosis not present

## 2021-01-04 ENCOUNTER — Encounter: Payer: PPO | Admitting: Occupational Therapy

## 2021-01-05 DIAGNOSIS — R269 Unspecified abnormalities of gait and mobility: Secondary | ICD-10-CM | POA: Diagnosis not present

## 2021-01-12 ENCOUNTER — Encounter: Payer: PPO | Admitting: Occupational Therapy

## 2021-01-14 DIAGNOSIS — R269 Unspecified abnormalities of gait and mobility: Secondary | ICD-10-CM | POA: Diagnosis not present

## 2021-01-20 ENCOUNTER — Encounter: Payer: PPO | Admitting: Occupational Therapy

## 2021-01-20 DIAGNOSIS — E1165 Type 2 diabetes mellitus with hyperglycemia: Secondary | ICD-10-CM | POA: Diagnosis not present

## 2021-01-20 DIAGNOSIS — M353 Polymyalgia rheumatica: Secondary | ICD-10-CM | POA: Diagnosis not present

## 2021-01-20 DIAGNOSIS — Z794 Long term (current) use of insulin: Secondary | ICD-10-CM | POA: Diagnosis not present

## 2021-01-20 DIAGNOSIS — I10 Essential (primary) hypertension: Secondary | ICD-10-CM | POA: Diagnosis not present

## 2021-01-20 DIAGNOSIS — E785 Hyperlipidemia, unspecified: Secondary | ICD-10-CM | POA: Diagnosis not present

## 2021-01-25 ENCOUNTER — Encounter: Payer: PPO | Admitting: Occupational Therapy

## 2021-01-26 DIAGNOSIS — R269 Unspecified abnormalities of gait and mobility: Secondary | ICD-10-CM | POA: Diagnosis not present

## 2021-01-28 DIAGNOSIS — R269 Unspecified abnormalities of gait and mobility: Secondary | ICD-10-CM | POA: Diagnosis not present

## 2021-01-29 DIAGNOSIS — M064 Inflammatory polyarthropathy: Secondary | ICD-10-CM | POA: Diagnosis not present

## 2021-01-29 DIAGNOSIS — E663 Overweight: Secondary | ICD-10-CM | POA: Diagnosis not present

## 2021-01-29 DIAGNOSIS — M353 Polymyalgia rheumatica: Secondary | ICD-10-CM | POA: Diagnosis not present

## 2021-01-29 DIAGNOSIS — R5383 Other fatigue: Secondary | ICD-10-CM | POA: Diagnosis not present

## 2021-01-29 DIAGNOSIS — M159 Polyosteoarthritis, unspecified: Secondary | ICD-10-CM | POA: Diagnosis not present

## 2021-01-29 DIAGNOSIS — Z6825 Body mass index (BMI) 25.0-25.9, adult: Secondary | ICD-10-CM | POA: Diagnosis not present

## 2021-02-01 ENCOUNTER — Encounter: Payer: PPO | Admitting: Occupational Therapy

## 2021-02-08 DIAGNOSIS — M353 Polymyalgia rheumatica: Secondary | ICD-10-CM | POA: Diagnosis not present

## 2021-02-08 DIAGNOSIS — I10 Essential (primary) hypertension: Secondary | ICD-10-CM | POA: Diagnosis not present

## 2021-02-08 DIAGNOSIS — E1169 Type 2 diabetes mellitus with other specified complication: Secondary | ICD-10-CM | POA: Diagnosis not present

## 2021-02-08 DIAGNOSIS — Z794 Long term (current) use of insulin: Secondary | ICD-10-CM | POA: Diagnosis not present

## 2021-02-08 DIAGNOSIS — E663 Overweight: Secondary | ICD-10-CM | POA: Diagnosis not present

## 2021-02-18 DIAGNOSIS — Z1231 Encounter for screening mammogram for malignant neoplasm of breast: Secondary | ICD-10-CM | POA: Diagnosis not present

## 2021-03-23 DIAGNOSIS — Z794 Long term (current) use of insulin: Secondary | ICD-10-CM | POA: Diagnosis not present

## 2021-03-23 DIAGNOSIS — E1169 Type 2 diabetes mellitus with other specified complication: Secondary | ICD-10-CM | POA: Diagnosis not present

## 2021-03-23 DIAGNOSIS — R030 Elevated blood-pressure reading, without diagnosis of hypertension: Secondary | ICD-10-CM | POA: Diagnosis not present

## 2021-03-23 DIAGNOSIS — M353 Polymyalgia rheumatica: Secondary | ICD-10-CM | POA: Diagnosis not present

## 2021-04-13 DIAGNOSIS — M064 Inflammatory polyarthropathy: Secondary | ICD-10-CM | POA: Diagnosis not present

## 2021-04-13 DIAGNOSIS — M353 Polymyalgia rheumatica: Secondary | ICD-10-CM | POA: Diagnosis not present

## 2021-04-13 DIAGNOSIS — E663 Overweight: Secondary | ICD-10-CM | POA: Diagnosis not present

## 2021-04-13 DIAGNOSIS — Z6826 Body mass index (BMI) 26.0-26.9, adult: Secondary | ICD-10-CM | POA: Diagnosis not present

## 2021-04-13 DIAGNOSIS — M159 Polyosteoarthritis, unspecified: Secondary | ICD-10-CM | POA: Diagnosis not present

## 2021-04-14 DIAGNOSIS — I10 Essential (primary) hypertension: Secondary | ICD-10-CM | POA: Diagnosis not present

## 2021-04-14 DIAGNOSIS — M353 Polymyalgia rheumatica: Secondary | ICD-10-CM | POA: Diagnosis not present

## 2021-04-14 DIAGNOSIS — R2242 Localized swelling, mass and lump, left lower limb: Secondary | ICD-10-CM | POA: Diagnosis not present

## 2021-05-06 DIAGNOSIS — Z85828 Personal history of other malignant neoplasm of skin: Secondary | ICD-10-CM | POA: Diagnosis not present

## 2021-05-06 DIAGNOSIS — L649 Androgenic alopecia, unspecified: Secondary | ICD-10-CM | POA: Diagnosis not present

## 2021-05-06 DIAGNOSIS — D220 Melanocytic nevi of lip: Secondary | ICD-10-CM | POA: Diagnosis not present

## 2021-05-06 DIAGNOSIS — L918 Other hypertrophic disorders of the skin: Secondary | ICD-10-CM | POA: Diagnosis not present

## 2021-05-06 DIAGNOSIS — D1801 Hemangioma of skin and subcutaneous tissue: Secondary | ICD-10-CM | POA: Diagnosis not present

## 2021-05-06 DIAGNOSIS — L814 Other melanin hyperpigmentation: Secondary | ICD-10-CM | POA: Diagnosis not present

## 2021-05-06 DIAGNOSIS — D225 Melanocytic nevi of trunk: Secondary | ICD-10-CM | POA: Diagnosis not present

## 2021-05-06 DIAGNOSIS — L821 Other seborrheic keratosis: Secondary | ICD-10-CM | POA: Diagnosis not present

## 2021-05-06 DIAGNOSIS — D2262 Melanocytic nevi of left upper limb, including shoulder: Secondary | ICD-10-CM | POA: Diagnosis not present

## 2021-06-07 DIAGNOSIS — E785 Hyperlipidemia, unspecified: Secondary | ICD-10-CM | POA: Diagnosis not present

## 2021-06-07 DIAGNOSIS — I10 Essential (primary) hypertension: Secondary | ICD-10-CM | POA: Diagnosis not present

## 2021-06-07 DIAGNOSIS — E1165 Type 2 diabetes mellitus with hyperglycemia: Secondary | ICD-10-CM | POA: Diagnosis not present

## 2021-06-09 DIAGNOSIS — Z6827 Body mass index (BMI) 27.0-27.9, adult: Secondary | ICD-10-CM | POA: Diagnosis not present

## 2021-06-09 DIAGNOSIS — M353 Polymyalgia rheumatica: Secondary | ICD-10-CM | POA: Diagnosis not present

## 2021-06-09 DIAGNOSIS — E663 Overweight: Secondary | ICD-10-CM | POA: Diagnosis not present

## 2021-06-09 DIAGNOSIS — M159 Polyosteoarthritis, unspecified: Secondary | ICD-10-CM | POA: Diagnosis not present

## 2021-06-09 DIAGNOSIS — M064 Inflammatory polyarthropathy: Secondary | ICD-10-CM | POA: Diagnosis not present

## 2021-06-14 DIAGNOSIS — Z794 Long term (current) use of insulin: Secondary | ICD-10-CM | POA: Diagnosis not present

## 2021-06-14 DIAGNOSIS — E1169 Type 2 diabetes mellitus with other specified complication: Secondary | ICD-10-CM | POA: Diagnosis not present

## 2021-06-14 DIAGNOSIS — M1990 Unspecified osteoarthritis, unspecified site: Secondary | ICD-10-CM | POA: Diagnosis not present

## 2021-06-14 DIAGNOSIS — Z008 Encounter for other general examination: Secondary | ICD-10-CM | POA: Diagnosis not present

## 2021-06-14 DIAGNOSIS — E663 Overweight: Secondary | ICD-10-CM | POA: Diagnosis not present

## 2021-06-14 DIAGNOSIS — Z1339 Encounter for screening examination for other mental health and behavioral disorders: Secondary | ICD-10-CM | POA: Diagnosis not present

## 2021-06-14 DIAGNOSIS — Z1331 Encounter for screening for depression: Secondary | ICD-10-CM | POA: Diagnosis not present

## 2021-06-14 DIAGNOSIS — R82998 Other abnormal findings in urine: Secondary | ICD-10-CM | POA: Diagnosis not present

## 2021-06-14 DIAGNOSIS — Z1212 Encounter for screening for malignant neoplasm of rectum: Secondary | ICD-10-CM | POA: Diagnosis not present

## 2021-06-14 DIAGNOSIS — I10 Essential (primary) hypertension: Secondary | ICD-10-CM | POA: Diagnosis not present

## 2021-07-14 DIAGNOSIS — R8290 Unspecified abnormal findings in urine: Secondary | ICD-10-CM | POA: Diagnosis not present

## 2021-07-22 DIAGNOSIS — H2513 Age-related nuclear cataract, bilateral: Secondary | ICD-10-CM | POA: Diagnosis not present

## 2021-07-22 DIAGNOSIS — H353131 Nonexudative age-related macular degeneration, bilateral, early dry stage: Secondary | ICD-10-CM | POA: Diagnosis not present

## 2021-07-22 DIAGNOSIS — H5203 Hypermetropia, bilateral: Secondary | ICD-10-CM | POA: Diagnosis not present

## 2021-07-22 DIAGNOSIS — E1139 Type 2 diabetes mellitus with other diabetic ophthalmic complication: Secondary | ICD-10-CM | POA: Diagnosis not present

## 2021-07-22 DIAGNOSIS — H524 Presbyopia: Secondary | ICD-10-CM | POA: Diagnosis not present

## 2021-07-22 DIAGNOSIS — D23111 Other benign neoplasm of skin of right upper eyelid, including canthus: Secondary | ICD-10-CM | POA: Diagnosis not present

## 2021-07-26 DIAGNOSIS — M064 Inflammatory polyarthropathy: Secondary | ICD-10-CM | POA: Diagnosis not present

## 2021-07-26 DIAGNOSIS — M353 Polymyalgia rheumatica: Secondary | ICD-10-CM | POA: Diagnosis not present

## 2021-07-26 DIAGNOSIS — M159 Polyosteoarthritis, unspecified: Secondary | ICD-10-CM | POA: Diagnosis not present

## 2021-07-26 DIAGNOSIS — M0609 Rheumatoid arthritis without rheumatoid factor, multiple sites: Secondary | ICD-10-CM | POA: Diagnosis not present

## 2021-07-26 DIAGNOSIS — E663 Overweight: Secondary | ICD-10-CM | POA: Diagnosis not present

## 2021-07-26 DIAGNOSIS — Z6827 Body mass index (BMI) 27.0-27.9, adult: Secondary | ICD-10-CM | POA: Diagnosis not present

## 2021-07-28 DIAGNOSIS — M50323 Other cervical disc degeneration at C6-C7 level: Secondary | ICD-10-CM | POA: Diagnosis not present

## 2021-07-28 DIAGNOSIS — M9901 Segmental and somatic dysfunction of cervical region: Secondary | ICD-10-CM | POA: Diagnosis not present

## 2021-07-28 DIAGNOSIS — M50321 Other cervical disc degeneration at C4-C5 level: Secondary | ICD-10-CM | POA: Diagnosis not present

## 2021-07-28 DIAGNOSIS — M50322 Other cervical disc degeneration at C5-C6 level: Secondary | ICD-10-CM | POA: Diagnosis not present

## 2021-07-29 DIAGNOSIS — M50322 Other cervical disc degeneration at C5-C6 level: Secondary | ICD-10-CM | POA: Diagnosis not present

## 2021-07-29 DIAGNOSIS — M50321 Other cervical disc degeneration at C4-C5 level: Secondary | ICD-10-CM | POA: Diagnosis not present

## 2021-07-29 DIAGNOSIS — M50323 Other cervical disc degeneration at C6-C7 level: Secondary | ICD-10-CM | POA: Diagnosis not present

## 2021-07-29 DIAGNOSIS — M9901 Segmental and somatic dysfunction of cervical region: Secondary | ICD-10-CM | POA: Diagnosis not present

## 2021-08-25 DIAGNOSIS — M50323 Other cervical disc degeneration at C6-C7 level: Secondary | ICD-10-CM | POA: Diagnosis not present

## 2021-08-25 DIAGNOSIS — M50322 Other cervical disc degeneration at C5-C6 level: Secondary | ICD-10-CM | POA: Diagnosis not present

## 2021-08-25 DIAGNOSIS — M9901 Segmental and somatic dysfunction of cervical region: Secondary | ICD-10-CM | POA: Diagnosis not present

## 2021-08-25 DIAGNOSIS — M50321 Other cervical disc degeneration at C4-C5 level: Secondary | ICD-10-CM | POA: Diagnosis not present

## 2021-08-26 DIAGNOSIS — M50322 Other cervical disc degeneration at C5-C6 level: Secondary | ICD-10-CM | POA: Diagnosis not present

## 2021-08-26 DIAGNOSIS — Z8616 Personal history of COVID-19: Secondary | ICD-10-CM | POA: Diagnosis not present

## 2021-08-26 DIAGNOSIS — M50323 Other cervical disc degeneration at C6-C7 level: Secondary | ICD-10-CM | POA: Diagnosis not present

## 2021-08-26 DIAGNOSIS — M79671 Pain in right foot: Secondary | ICD-10-CM | POA: Diagnosis not present

## 2021-08-26 DIAGNOSIS — M9901 Segmental and somatic dysfunction of cervical region: Secondary | ICD-10-CM | POA: Diagnosis not present

## 2021-08-26 DIAGNOSIS — M50321 Other cervical disc degeneration at C4-C5 level: Secondary | ICD-10-CM | POA: Diagnosis not present

## 2021-08-30 DIAGNOSIS — M50323 Other cervical disc degeneration at C6-C7 level: Secondary | ICD-10-CM | POA: Diagnosis not present

## 2021-08-30 DIAGNOSIS — M50322 Other cervical disc degeneration at C5-C6 level: Secondary | ICD-10-CM | POA: Diagnosis not present

## 2021-08-30 DIAGNOSIS — M50321 Other cervical disc degeneration at C4-C5 level: Secondary | ICD-10-CM | POA: Diagnosis not present

## 2021-08-30 DIAGNOSIS — M9901 Segmental and somatic dysfunction of cervical region: Secondary | ICD-10-CM | POA: Diagnosis not present

## 2021-09-01 DIAGNOSIS — M0609 Rheumatoid arthritis without rheumatoid factor, multiple sites: Secondary | ICD-10-CM | POA: Diagnosis not present

## 2021-09-07 DIAGNOSIS — E1169 Type 2 diabetes mellitus with other specified complication: Secondary | ICD-10-CM | POA: Diagnosis not present

## 2021-09-07 DIAGNOSIS — E785 Hyperlipidemia, unspecified: Secondary | ICD-10-CM | POA: Diagnosis not present

## 2021-09-07 DIAGNOSIS — Z794 Long term (current) use of insulin: Secondary | ICD-10-CM | POA: Diagnosis not present

## 2021-09-07 DIAGNOSIS — I10 Essential (primary) hypertension: Secondary | ICD-10-CM | POA: Diagnosis not present

## 2021-09-07 DIAGNOSIS — M353 Polymyalgia rheumatica: Secondary | ICD-10-CM | POA: Diagnosis not present

## 2021-09-17 DIAGNOSIS — Z658 Other specified problems related to psychosocial circumstances: Secondary | ICD-10-CM | POA: Diagnosis not present

## 2021-09-17 DIAGNOSIS — E785 Hyperlipidemia, unspecified: Secondary | ICD-10-CM | POA: Diagnosis not present

## 2021-09-17 DIAGNOSIS — E1169 Type 2 diabetes mellitus with other specified complication: Secondary | ICD-10-CM | POA: Diagnosis not present

## 2021-09-17 DIAGNOSIS — R002 Palpitations: Secondary | ICD-10-CM | POA: Diagnosis not present

## 2021-09-17 DIAGNOSIS — I10 Essential (primary) hypertension: Secondary | ICD-10-CM | POA: Diagnosis not present

## 2021-09-17 DIAGNOSIS — M353 Polymyalgia rheumatica: Secondary | ICD-10-CM | POA: Diagnosis not present

## 2021-09-17 DIAGNOSIS — F329 Major depressive disorder, single episode, unspecified: Secondary | ICD-10-CM | POA: Diagnosis not present

## 2021-09-23 ENCOUNTER — Ambulatory Visit: Payer: PPO | Admitting: Orthopedic Surgery

## 2021-09-23 ENCOUNTER — Other Ambulatory Visit: Payer: Self-pay

## 2021-09-23 DIAGNOSIS — M79671 Pain in right foot: Secondary | ICD-10-CM | POA: Diagnosis not present

## 2021-09-26 ENCOUNTER — Encounter: Payer: Self-pay | Admitting: Orthopedic Surgery

## 2021-09-26 NOTE — Progress Notes (Signed)
Office Visit Note   Patient: Shawna Payne           Date of Birth: November 14, 1950           MRN: [id number] Visit Date: 09/23/2021              Requested by: Burnard Bunting, MD [address] [place],  [place] [address] PCP: Burnard Bunting, MD  Chief Complaint  Patient presents with   Right Foot - Follow-up      HPI: Patient is a 71 year old woman who presents complaining of a small mass on the dorsum of the right foot directly over the second metatarsal head.  She states this started after she dropped a laptop tray on her foot in December.  She states she has pain with rubbing in her shoe wear over this area.  Radiographs obtained were within normal limits.  Assessment & Plan: Visit Diagnoses:  1. Pain in right foot     Plan: Clinically patient has ruptured the extensor tendon to the second toe.  There is compensation from the lateral bands and there is not a toe drop.  We will follow this conservatively.  Follow-Up Instructions: Return if symptoms worsen or fail to improve.   Ortho Exam  Patient is alert, oriented, no adenopathy, well-dressed, normal affect, normal respiratory effort. Examination patient has good pulses.  She has a small 0.5 cm mass over the MTP joint of the second toe.  There is no clawing of the toe no plantarflexion of the toe patient does have weakness with extension of the second toe.  The mass appears to be the residual end of the ruptured extensor tendon.  Patient does have collateral tendons intact which keep the toe straight.  Imaging: No results found. No images are attached to the encounter.  Labs: No results found for: HGBA1C, ESRSEDRATE, CRP, LABURIC, REPTSTATUS, GRAMSTAIN, CULT, LABORGA   Lab Results  Component Value Date   ALBUMIN 4.1 08/03/2016    No results found for: MG No results found for: VD25OH  No results found for: PREALBUMIN CBC EXTENDED Latest Ref Rng & Units 08/03/2016  WBC 3.9 - 10.3 10e3/uL 6.6  RBC 3.70 - 5.45  10e6/uL 5.01  HGB 11.6 - 15.9 g/dL 13.9  HCT 34.8 - 46.6 % 42.4  PLT 145 - 400 10e3/uL 182  NEUTROABS 1.5 - 6.5 10e3/uL 4.2  LYMPHSABS 0.9 - 3.3 10e3/uL 1.7     There is no height or weight on file to calculate BMI.  Orders:  No orders of the defined types were placed in this encounter.  No orders of the defined types were placed in this encounter.    Procedures: No procedures performed  Clinical Data: No additional findings.  ROS:  All other systems negative, except as noted in the HPI. Review of Systems  Objective: Vital Signs: There were no vitals taken for this visit.  Specialty Comments:  No specialty comments available.  PMFS History: Patient Active Problem List   Diagnosis Date Noted   Bilateral shoulder pain 10/14/2020   Radial scar of breast 08/03/2016   Ductal hyperplasia of breast 08/03/2016   At high risk for breast cancer 08/03/2016   Past Medical History:  Diagnosis Date   Allergy    Breast mass, left    Complication of anesthesia    Diabetes mellitus without complication ([facility])    PONV (postoperative nausea and vomiting)    Seasonal allergies     Family History  Problem Relation Age of Onset  Colon cancer Neg Hx    Esophageal cancer Neg Hx    Rectal cancer Neg Hx    Stomach cancer Neg Hx     Past Surgical History:  Procedure Laterality Date   BREAST LUMPECTOMY WITH RADIOACTIVE SEED LOCALIZATION Left 06/30/2016   Procedure: LEFT BREAST LUMPECTOMY WITH RADIOACTIVE SEED LOCALIZATION;  Surgeon: Autumn Messing III, MD;  Location: [facility];  Service: General;  Laterality: Left;   BREAST SURGERY Left    papilloma   CHOLECYSTECTOMY     KNEE ARTHROSCOPY Left    TONSILLECTOMY     Social History   Occupational History   Not on file  Tobacco Use   Smoking status: Former   Smokeless tobacco: Never   Tobacco comments:    Quit 50 years ago.   Substance and Sexual Activity   Alcohol use: No   Drug use: No   Sexual activity:  Not on file

## 2021-09-29 DIAGNOSIS — Z79899 Other long term (current) drug therapy: Secondary | ICD-10-CM | POA: Diagnosis not present

## 2021-09-29 DIAGNOSIS — M0609 Rheumatoid arthritis without rheumatoid factor, multiple sites: Secondary | ICD-10-CM | POA: Diagnosis not present

## 2021-09-29 DIAGNOSIS — M159 Polyosteoarthritis, unspecified: Secondary | ICD-10-CM | POA: Diagnosis not present

## 2021-09-29 DIAGNOSIS — Z6826 Body mass index (BMI) 26.0-26.9, adult: Secondary | ICD-10-CM | POA: Diagnosis not present

## 2021-09-29 DIAGNOSIS — M353 Polymyalgia rheumatica: Secondary | ICD-10-CM | POA: Diagnosis not present

## 2021-09-29 DIAGNOSIS — E663 Overweight: Secondary | ICD-10-CM | POA: Diagnosis not present

## 2021-10-04 DIAGNOSIS — I10 Essential (primary) hypertension: Secondary | ICD-10-CM | POA: Diagnosis not present

## 2021-10-04 DIAGNOSIS — E1169 Type 2 diabetes mellitus with other specified complication: Secondary | ICD-10-CM | POA: Diagnosis not present

## 2021-10-04 DIAGNOSIS — Z658 Other specified problems related to psychosocial circumstances: Secondary | ICD-10-CM | POA: Diagnosis not present

## 2021-10-04 DIAGNOSIS — F329 Major depressive disorder, single episode, unspecified: Secondary | ICD-10-CM | POA: Diagnosis not present

## 2021-10-25 DIAGNOSIS — E663 Overweight: Secondary | ICD-10-CM | POA: Diagnosis not present

## 2021-10-25 DIAGNOSIS — Z634 Disappearance and death of family member: Secondary | ICD-10-CM | POA: Diagnosis not present

## 2021-10-25 DIAGNOSIS — I10 Essential (primary) hypertension: Secondary | ICD-10-CM | POA: Diagnosis not present

## 2021-10-25 DIAGNOSIS — E1165 Type 2 diabetes mellitus with hyperglycemia: Secondary | ICD-10-CM | POA: Diagnosis not present

## 2021-10-25 DIAGNOSIS — Z794 Long term (current) use of insulin: Secondary | ICD-10-CM | POA: Diagnosis not present

## 2021-10-27 DIAGNOSIS — M0609 Rheumatoid arthritis without rheumatoid factor, multiple sites: Secondary | ICD-10-CM | POA: Diagnosis not present

## 2021-11-01 DIAGNOSIS — F329 Major depressive disorder, single episode, unspecified: Secondary | ICD-10-CM | POA: Diagnosis not present

## 2021-11-01 DIAGNOSIS — Z658 Other specified problems related to psychosocial circumstances: Secondary | ICD-10-CM | POA: Diagnosis not present

## 2021-11-01 DIAGNOSIS — Z634 Disappearance and death of family member: Secondary | ICD-10-CM | POA: Diagnosis not present

## 2021-11-01 DIAGNOSIS — I10 Essential (primary) hypertension: Secondary | ICD-10-CM | POA: Diagnosis not present

## 2021-11-29 DIAGNOSIS — Z79899 Other long term (current) drug therapy: Secondary | ICD-10-CM | POA: Diagnosis not present

## 2021-11-29 DIAGNOSIS — E663 Overweight: Secondary | ICD-10-CM | POA: Diagnosis not present

## 2021-11-29 DIAGNOSIS — M1991 Primary osteoarthritis, unspecified site: Secondary | ICD-10-CM | POA: Diagnosis not present

## 2021-11-29 DIAGNOSIS — Z6826 Body mass index (BMI) 26.0-26.9, adult: Secondary | ICD-10-CM | POA: Diagnosis not present

## 2021-11-29 DIAGNOSIS — M0609 Rheumatoid arthritis without rheumatoid factor, multiple sites: Secondary | ICD-10-CM | POA: Diagnosis not present

## 2021-11-29 DIAGNOSIS — M353 Polymyalgia rheumatica: Secondary | ICD-10-CM | POA: Diagnosis not present

## 2021-12-08 DIAGNOSIS — E785 Hyperlipidemia, unspecified: Secondary | ICD-10-CM | POA: Diagnosis not present

## 2021-12-08 DIAGNOSIS — I10 Essential (primary) hypertension: Secondary | ICD-10-CM | POA: Diagnosis not present

## 2021-12-08 DIAGNOSIS — M353 Polymyalgia rheumatica: Secondary | ICD-10-CM | POA: Diagnosis not present

## 2021-12-08 DIAGNOSIS — E1169 Type 2 diabetes mellitus with other specified complication: Secondary | ICD-10-CM | POA: Diagnosis not present

## 2021-12-08 DIAGNOSIS — Z794 Long term (current) use of insulin: Secondary | ICD-10-CM | POA: Diagnosis not present

## 2021-12-28 DIAGNOSIS — M0609 Rheumatoid arthritis without rheumatoid factor, multiple sites: Secondary | ICD-10-CM | POA: Diagnosis not present

## 2022-01-04 DIAGNOSIS — M79642 Pain in left hand: Secondary | ICD-10-CM | POA: Diagnosis not present

## 2022-01-11 DIAGNOSIS — Z85828 Personal history of other malignant neoplasm of skin: Secondary | ICD-10-CM | POA: Diagnosis not present

## 2022-01-11 DIAGNOSIS — L720 Epidermal cyst: Secondary | ICD-10-CM | POA: Diagnosis not present

## 2022-01-11 DIAGNOSIS — L820 Inflamed seborrheic keratosis: Secondary | ICD-10-CM | POA: Diagnosis not present

## 2022-01-18 DIAGNOSIS — E1169 Type 2 diabetes mellitus with other specified complication: Secondary | ICD-10-CM | POA: Diagnosis not present

## 2022-01-18 DIAGNOSIS — R5383 Other fatigue: Secondary | ICD-10-CM | POA: Diagnosis not present

## 2022-01-18 DIAGNOSIS — R0981 Nasal congestion: Secondary | ICD-10-CM | POA: Diagnosis not present

## 2022-01-18 DIAGNOSIS — J209 Acute bronchitis, unspecified: Secondary | ICD-10-CM | POA: Diagnosis not present

## 2022-01-18 DIAGNOSIS — M353 Polymyalgia rheumatica: Secondary | ICD-10-CM | POA: Diagnosis not present

## 2022-01-18 DIAGNOSIS — B370 Candidal stomatitis: Secondary | ICD-10-CM | POA: Diagnosis not present

## 2022-01-18 DIAGNOSIS — R051 Acute cough: Secondary | ICD-10-CM | POA: Diagnosis not present

## 2022-01-18 DIAGNOSIS — H00012 Hordeolum externum right lower eyelid: Secondary | ICD-10-CM | POA: Diagnosis not present

## 2022-01-18 DIAGNOSIS — Z1152 Encounter for screening for COVID-19: Secondary | ICD-10-CM | POA: Diagnosis not present

## 2022-02-08 DIAGNOSIS — M1991 Primary osteoarthritis, unspecified site: Secondary | ICD-10-CM | POA: Diagnosis not present

## 2022-02-08 DIAGNOSIS — E663 Overweight: Secondary | ICD-10-CM | POA: Diagnosis not present

## 2022-02-08 DIAGNOSIS — Z6826 Body mass index (BMI) 26.0-26.9, adult: Secondary | ICD-10-CM | POA: Diagnosis not present

## 2022-02-08 DIAGNOSIS — Z79899 Other long term (current) drug therapy: Secondary | ICD-10-CM | POA: Diagnosis not present

## 2022-02-08 DIAGNOSIS — M0609 Rheumatoid arthritis without rheumatoid factor, multiple sites: Secondary | ICD-10-CM | POA: Diagnosis not present

## 2022-02-21 DIAGNOSIS — E785 Hyperlipidemia, unspecified: Secondary | ICD-10-CM | POA: Diagnosis not present

## 2022-02-21 DIAGNOSIS — M353 Polymyalgia rheumatica: Secondary | ICD-10-CM | POA: Diagnosis not present

## 2022-02-21 DIAGNOSIS — F329 Major depressive disorder, single episode, unspecified: Secondary | ICD-10-CM | POA: Diagnosis not present

## 2022-02-21 DIAGNOSIS — I10 Essential (primary) hypertension: Secondary | ICD-10-CM | POA: Diagnosis not present

## 2022-02-21 DIAGNOSIS — E1169 Type 2 diabetes mellitus with other specified complication: Secondary | ICD-10-CM | POA: Diagnosis not present

## 2022-02-21 DIAGNOSIS — R5383 Other fatigue: Secondary | ICD-10-CM | POA: Diagnosis not present

## 2022-02-21 DIAGNOSIS — J45909 Unspecified asthma, uncomplicated: Secondary | ICD-10-CM | POA: Diagnosis not present

## 2022-02-21 DIAGNOSIS — E663 Overweight: Secondary | ICD-10-CM | POA: Diagnosis not present

## 2022-02-21 DIAGNOSIS — Z794 Long term (current) use of insulin: Secondary | ICD-10-CM | POA: Diagnosis not present

## 2022-03-16 DIAGNOSIS — H5203 Hypermetropia, bilateral: Secondary | ICD-10-CM | POA: Diagnosis not present

## 2022-03-16 DIAGNOSIS — H524 Presbyopia: Secondary | ICD-10-CM | POA: Diagnosis not present

## 2022-03-16 DIAGNOSIS — H2513 Age-related nuclear cataract, bilateral: Secondary | ICD-10-CM | POA: Diagnosis not present

## 2022-03-16 DIAGNOSIS — E1139 Type 2 diabetes mellitus with other diabetic ophthalmic complication: Secondary | ICD-10-CM | POA: Diagnosis not present

## 2022-03-16 DIAGNOSIS — H35033 Hypertensive retinopathy, bilateral: Secondary | ICD-10-CM | POA: Diagnosis not present

## 2022-03-16 DIAGNOSIS — H353131 Nonexudative age-related macular degeneration, bilateral, early dry stage: Secondary | ICD-10-CM | POA: Diagnosis not present

## 2022-03-25 DIAGNOSIS — J45909 Unspecified asthma, uncomplicated: Secondary | ICD-10-CM | POA: Diagnosis not present

## 2022-03-25 DIAGNOSIS — M353 Polymyalgia rheumatica: Secondary | ICD-10-CM | POA: Diagnosis not present

## 2022-03-25 DIAGNOSIS — J209 Acute bronchitis, unspecified: Secondary | ICD-10-CM | POA: Diagnosis not present

## 2022-03-25 DIAGNOSIS — J069 Acute upper respiratory infection, unspecified: Secondary | ICD-10-CM | POA: Diagnosis not present

## 2022-03-25 DIAGNOSIS — I10 Essential (primary) hypertension: Secondary | ICD-10-CM | POA: Diagnosis not present

## 2022-03-25 DIAGNOSIS — R051 Acute cough: Secondary | ICD-10-CM | POA: Diagnosis not present

## 2022-04-11 DIAGNOSIS — E663 Overweight: Secondary | ICD-10-CM | POA: Diagnosis not present

## 2022-04-11 DIAGNOSIS — M1991 Primary osteoarthritis, unspecified site: Secondary | ICD-10-CM | POA: Diagnosis not present

## 2022-04-11 DIAGNOSIS — Z79899 Other long term (current) drug therapy: Secondary | ICD-10-CM | POA: Diagnosis not present

## 2022-04-11 DIAGNOSIS — M353 Polymyalgia rheumatica: Secondary | ICD-10-CM | POA: Diagnosis not present

## 2022-04-11 DIAGNOSIS — Z6825 Body mass index (BMI) 25.0-25.9, adult: Secondary | ICD-10-CM | POA: Diagnosis not present

## 2022-04-11 DIAGNOSIS — M0609 Rheumatoid arthritis without rheumatoid factor, multiple sites: Secondary | ICD-10-CM | POA: Diagnosis not present

## 2022-04-20 DIAGNOSIS — H353131 Nonexudative age-related macular degeneration, bilateral, early dry stage: Secondary | ICD-10-CM | POA: Diagnosis not present

## 2022-04-20 DIAGNOSIS — H35033 Hypertensive retinopathy, bilateral: Secondary | ICD-10-CM | POA: Diagnosis not present

## 2022-04-25 DIAGNOSIS — M545 Low back pain, unspecified: Secondary | ICD-10-CM | POA: Diagnosis not present

## 2022-04-25 DIAGNOSIS — M25551 Pain in right hip: Secondary | ICD-10-CM | POA: Diagnosis not present

## 2022-04-26 DIAGNOSIS — E785 Hyperlipidemia, unspecified: Secondary | ICD-10-CM | POA: Diagnosis not present

## 2022-04-26 DIAGNOSIS — Z794 Long term (current) use of insulin: Secondary | ICD-10-CM | POA: Diagnosis not present

## 2022-04-26 DIAGNOSIS — E1169 Type 2 diabetes mellitus with other specified complication: Secondary | ICD-10-CM | POA: Diagnosis not present

## 2022-04-26 DIAGNOSIS — I10 Essential (primary) hypertension: Secondary | ICD-10-CM | POA: Diagnosis not present

## 2022-04-27 DIAGNOSIS — R531 Weakness: Secondary | ICD-10-CM | POA: Diagnosis not present

## 2022-04-27 DIAGNOSIS — R262 Difficulty in walking, not elsewhere classified: Secondary | ICD-10-CM | POA: Diagnosis not present

## 2022-04-27 DIAGNOSIS — M5441 Lumbago with sciatica, right side: Secondary | ICD-10-CM | POA: Diagnosis not present

## 2022-04-27 DIAGNOSIS — M5451 Vertebrogenic low back pain: Secondary | ICD-10-CM | POA: Diagnosis not present

## 2022-04-29 DIAGNOSIS — Z1231 Encounter for screening mammogram for malignant neoplasm of breast: Secondary | ICD-10-CM | POA: Diagnosis not present

## 2022-05-03 DIAGNOSIS — I10 Essential (primary) hypertension: Secondary | ICD-10-CM | POA: Diagnosis not present

## 2022-05-03 DIAGNOSIS — J069 Acute upper respiratory infection, unspecified: Secondary | ICD-10-CM | POA: Diagnosis not present

## 2022-05-03 DIAGNOSIS — Z8709 Personal history of other diseases of the respiratory system: Secondary | ICD-10-CM | POA: Diagnosis not present

## 2022-05-03 DIAGNOSIS — J209 Acute bronchitis, unspecified: Secondary | ICD-10-CM | POA: Diagnosis not present

## 2022-05-03 DIAGNOSIS — R051 Acute cough: Secondary | ICD-10-CM | POA: Diagnosis not present

## 2022-05-03 DIAGNOSIS — J4541 Moderate persistent asthma with (acute) exacerbation: Secondary | ICD-10-CM | POA: Diagnosis not present

## 2022-05-03 DIAGNOSIS — J45909 Unspecified asthma, uncomplicated: Secondary | ICD-10-CM | POA: Diagnosis not present

## 2022-05-03 DIAGNOSIS — Z1152 Encounter for screening for COVID-19: Secondary | ICD-10-CM | POA: Diagnosis not present

## 2022-05-05 DIAGNOSIS — M5451 Vertebrogenic low back pain: Secondary | ICD-10-CM | POA: Diagnosis not present

## 2022-05-05 DIAGNOSIS — M5441 Lumbago with sciatica, right side: Secondary | ICD-10-CM | POA: Diagnosis not present

## 2022-05-05 DIAGNOSIS — R531 Weakness: Secondary | ICD-10-CM | POA: Diagnosis not present

## 2022-05-05 DIAGNOSIS — R262 Difficulty in walking, not elsewhere classified: Secondary | ICD-10-CM | POA: Diagnosis not present

## 2022-05-10 DIAGNOSIS — R262 Difficulty in walking, not elsewhere classified: Secondary | ICD-10-CM | POA: Diagnosis not present

## 2022-05-10 DIAGNOSIS — M5441 Lumbago with sciatica, right side: Secondary | ICD-10-CM | POA: Diagnosis not present

## 2022-05-10 DIAGNOSIS — M5451 Vertebrogenic low back pain: Secondary | ICD-10-CM | POA: Diagnosis not present

## 2022-05-10 DIAGNOSIS — R531 Weakness: Secondary | ICD-10-CM | POA: Diagnosis not present

## 2022-05-11 DIAGNOSIS — R922 Inconclusive mammogram: Secondary | ICD-10-CM | POA: Diagnosis not present

## 2022-05-11 DIAGNOSIS — N6489 Other specified disorders of breast: Secondary | ICD-10-CM | POA: Diagnosis not present

## 2022-05-11 DIAGNOSIS — R928 Other abnormal and inconclusive findings on diagnostic imaging of breast: Secondary | ICD-10-CM | POA: Diagnosis not present

## 2022-05-13 DIAGNOSIS — R531 Weakness: Secondary | ICD-10-CM | POA: Diagnosis not present

## 2022-05-13 DIAGNOSIS — M5451 Vertebrogenic low back pain: Secondary | ICD-10-CM | POA: Diagnosis not present

## 2022-05-13 DIAGNOSIS — R262 Difficulty in walking, not elsewhere classified: Secondary | ICD-10-CM | POA: Diagnosis not present

## 2022-05-13 DIAGNOSIS — M5441 Lumbago with sciatica, right side: Secondary | ICD-10-CM | POA: Diagnosis not present

## 2022-05-16 DIAGNOSIS — R531 Weakness: Secondary | ICD-10-CM | POA: Diagnosis not present

## 2022-05-16 DIAGNOSIS — R262 Difficulty in walking, not elsewhere classified: Secondary | ICD-10-CM | POA: Diagnosis not present

## 2022-05-16 DIAGNOSIS — M5441 Lumbago with sciatica, right side: Secondary | ICD-10-CM | POA: Diagnosis not present

## 2022-05-16 DIAGNOSIS — M5451 Vertebrogenic low back pain: Secondary | ICD-10-CM | POA: Diagnosis not present

## 2022-05-18 DIAGNOSIS — R531 Weakness: Secondary | ICD-10-CM | POA: Diagnosis not present

## 2022-05-18 DIAGNOSIS — M5451 Vertebrogenic low back pain: Secondary | ICD-10-CM | POA: Diagnosis not present

## 2022-05-18 DIAGNOSIS — R262 Difficulty in walking, not elsewhere classified: Secondary | ICD-10-CM | POA: Diagnosis not present

## 2022-05-18 DIAGNOSIS — M5441 Lumbago with sciatica, right side: Secondary | ICD-10-CM | POA: Diagnosis not present

## 2022-05-24 DIAGNOSIS — M5441 Lumbago with sciatica, right side: Secondary | ICD-10-CM | POA: Diagnosis not present

## 2022-05-24 DIAGNOSIS — R531 Weakness: Secondary | ICD-10-CM | POA: Diagnosis not present

## 2022-05-24 DIAGNOSIS — R262 Difficulty in walking, not elsewhere classified: Secondary | ICD-10-CM | POA: Diagnosis not present

## 2022-05-24 DIAGNOSIS — M5451 Vertebrogenic low back pain: Secondary | ICD-10-CM | POA: Diagnosis not present

## 2022-06-06 DIAGNOSIS — R262 Difficulty in walking, not elsewhere classified: Secondary | ICD-10-CM | POA: Diagnosis not present

## 2022-06-06 DIAGNOSIS — R531 Weakness: Secondary | ICD-10-CM | POA: Diagnosis not present

## 2022-06-06 DIAGNOSIS — M5451 Vertebrogenic low back pain: Secondary | ICD-10-CM | POA: Diagnosis not present

## 2022-06-06 DIAGNOSIS — M545 Low back pain, unspecified: Secondary | ICD-10-CM | POA: Diagnosis not present

## 2022-06-06 DIAGNOSIS — M5441 Lumbago with sciatica, right side: Secondary | ICD-10-CM | POA: Diagnosis not present

## 2022-06-08 DIAGNOSIS — R531 Weakness: Secondary | ICD-10-CM | POA: Diagnosis not present

## 2022-06-08 DIAGNOSIS — M5441 Lumbago with sciatica, right side: Secondary | ICD-10-CM | POA: Diagnosis not present

## 2022-06-08 DIAGNOSIS — R262 Difficulty in walking, not elsewhere classified: Secondary | ICD-10-CM | POA: Diagnosis not present

## 2022-06-08 DIAGNOSIS — M5451 Vertebrogenic low back pain: Secondary | ICD-10-CM | POA: Diagnosis not present

## 2022-06-15 DIAGNOSIS — R531 Weakness: Secondary | ICD-10-CM | POA: Diagnosis not present

## 2022-06-15 DIAGNOSIS — R262 Difficulty in walking, not elsewhere classified: Secondary | ICD-10-CM | POA: Diagnosis not present

## 2022-06-15 DIAGNOSIS — M5451 Vertebrogenic low back pain: Secondary | ICD-10-CM | POA: Diagnosis not present

## 2022-06-15 DIAGNOSIS — M5441 Lumbago with sciatica, right side: Secondary | ICD-10-CM | POA: Diagnosis not present

## 2022-06-20 DIAGNOSIS — E785 Hyperlipidemia, unspecified: Secondary | ICD-10-CM | POA: Diagnosis not present

## 2022-06-20 DIAGNOSIS — R531 Weakness: Secondary | ICD-10-CM | POA: Diagnosis not present

## 2022-06-20 DIAGNOSIS — M5451 Vertebrogenic low back pain: Secondary | ICD-10-CM | POA: Diagnosis not present

## 2022-06-20 DIAGNOSIS — M5441 Lumbago with sciatica, right side: Secondary | ICD-10-CM | POA: Diagnosis not present

## 2022-06-20 DIAGNOSIS — R5383 Other fatigue: Secondary | ICD-10-CM | POA: Diagnosis not present

## 2022-06-20 DIAGNOSIS — E1169 Type 2 diabetes mellitus with other specified complication: Secondary | ICD-10-CM | POA: Diagnosis not present

## 2022-06-20 DIAGNOSIS — I10 Essential (primary) hypertension: Secondary | ICD-10-CM | POA: Diagnosis not present

## 2022-06-20 DIAGNOSIS — R262 Difficulty in walking, not elsewhere classified: Secondary | ICD-10-CM | POA: Diagnosis not present

## 2022-06-20 DIAGNOSIS — R7989 Other specified abnormal findings of blood chemistry: Secondary | ICD-10-CM | POA: Diagnosis not present

## 2022-06-22 DIAGNOSIS — M5441 Lumbago with sciatica, right side: Secondary | ICD-10-CM | POA: Diagnosis not present

## 2022-06-22 DIAGNOSIS — R531 Weakness: Secondary | ICD-10-CM | POA: Diagnosis not present

## 2022-06-22 DIAGNOSIS — M5451 Vertebrogenic low back pain: Secondary | ICD-10-CM | POA: Diagnosis not present

## 2022-06-22 DIAGNOSIS — R262 Difficulty in walking, not elsewhere classified: Secondary | ICD-10-CM | POA: Diagnosis not present

## 2022-06-23 DIAGNOSIS — J300 Vasomotor rhinitis: Secondary | ICD-10-CM | POA: Diagnosis not present

## 2022-06-23 DIAGNOSIS — R052 Subacute cough: Secondary | ICD-10-CM | POA: Diagnosis not present

## 2022-06-26 DIAGNOSIS — Z1212 Encounter for screening for malignant neoplasm of rectum: Secondary | ICD-10-CM | POA: Diagnosis not present

## 2022-06-27 DIAGNOSIS — Z1339 Encounter for screening examination for other mental health and behavioral disorders: Secondary | ICD-10-CM | POA: Diagnosis not present

## 2022-06-27 DIAGNOSIS — E663 Overweight: Secondary | ICD-10-CM | POA: Diagnosis not present

## 2022-06-27 DIAGNOSIS — Z1331 Encounter for screening for depression: Secondary | ICD-10-CM | POA: Diagnosis not present

## 2022-06-27 DIAGNOSIS — Z0000 Encounter for general adult medical examination without abnormal findings: Secondary | ICD-10-CM | POA: Diagnosis not present

## 2022-06-27 DIAGNOSIS — I10 Essential (primary) hypertension: Secondary | ICD-10-CM | POA: Diagnosis not present

## 2022-06-27 DIAGNOSIS — M5416 Radiculopathy, lumbar region: Secondary | ICD-10-CM | POA: Diagnosis not present

## 2022-06-27 DIAGNOSIS — E1169 Type 2 diabetes mellitus with other specified complication: Secondary | ICD-10-CM | POA: Diagnosis not present

## 2022-06-27 DIAGNOSIS — K2100 Gastro-esophageal reflux disease with esophagitis, without bleeding: Secondary | ICD-10-CM | POA: Diagnosis not present

## 2022-06-27 DIAGNOSIS — E119 Type 2 diabetes mellitus without complications: Secondary | ICD-10-CM | POA: Diagnosis not present

## 2022-06-27 DIAGNOSIS — Z794 Long term (current) use of insulin: Secondary | ICD-10-CM | POA: Diagnosis not present

## 2022-06-27 DIAGNOSIS — M353 Polymyalgia rheumatica: Secondary | ICD-10-CM | POA: Diagnosis not present

## 2022-06-28 DIAGNOSIS — M5451 Vertebrogenic low back pain: Secondary | ICD-10-CM | POA: Diagnosis not present

## 2022-06-28 DIAGNOSIS — R531 Weakness: Secondary | ICD-10-CM | POA: Diagnosis not present

## 2022-06-28 DIAGNOSIS — M5441 Lumbago with sciatica, right side: Secondary | ICD-10-CM | POA: Diagnosis not present

## 2022-06-28 DIAGNOSIS — R262 Difficulty in walking, not elsewhere classified: Secondary | ICD-10-CM | POA: Diagnosis not present

## 2022-06-30 DIAGNOSIS — M5451 Vertebrogenic low back pain: Secondary | ICD-10-CM | POA: Diagnosis not present

## 2022-06-30 DIAGNOSIS — M5441 Lumbago with sciatica, right side: Secondary | ICD-10-CM | POA: Diagnosis not present

## 2022-06-30 DIAGNOSIS — R262 Difficulty in walking, not elsewhere classified: Secondary | ICD-10-CM | POA: Diagnosis not present

## 2022-06-30 DIAGNOSIS — R531 Weakness: Secondary | ICD-10-CM | POA: Diagnosis not present

## 2022-07-04 DIAGNOSIS — R531 Weakness: Secondary | ICD-10-CM | POA: Diagnosis not present

## 2022-07-04 DIAGNOSIS — M5441 Lumbago with sciatica, right side: Secondary | ICD-10-CM | POA: Diagnosis not present

## 2022-07-04 DIAGNOSIS — M5451 Vertebrogenic low back pain: Secondary | ICD-10-CM | POA: Diagnosis not present

## 2022-07-04 DIAGNOSIS — R262 Difficulty in walking, not elsewhere classified: Secondary | ICD-10-CM | POA: Diagnosis not present

## 2022-07-13 DIAGNOSIS — M5451 Vertebrogenic low back pain: Secondary | ICD-10-CM | POA: Diagnosis not present

## 2022-07-13 DIAGNOSIS — R531 Weakness: Secondary | ICD-10-CM | POA: Diagnosis not present

## 2022-07-13 DIAGNOSIS — L821 Other seborrheic keratosis: Secondary | ICD-10-CM | POA: Diagnosis not present

## 2022-07-13 DIAGNOSIS — Z85828 Personal history of other malignant neoplasm of skin: Secondary | ICD-10-CM | POA: Diagnosis not present

## 2022-07-13 DIAGNOSIS — M5441 Lumbago with sciatica, right side: Secondary | ICD-10-CM | POA: Diagnosis not present

## 2022-07-13 DIAGNOSIS — R262 Difficulty in walking, not elsewhere classified: Secondary | ICD-10-CM | POA: Diagnosis not present

## 2022-07-18 DIAGNOSIS — M25551 Pain in right hip: Secondary | ICD-10-CM | POA: Diagnosis not present

## 2022-07-18 DIAGNOSIS — M545 Low back pain, unspecified: Secondary | ICD-10-CM | POA: Diagnosis not present

## 2022-07-19 DIAGNOSIS — M1991 Primary osteoarthritis, unspecified site: Secondary | ICD-10-CM | POA: Diagnosis not present

## 2022-07-19 DIAGNOSIS — M5136 Other intervertebral disc degeneration, lumbar region: Secondary | ICD-10-CM | POA: Diagnosis not present

## 2022-07-19 DIAGNOSIS — M0609 Rheumatoid arthritis without rheumatoid factor, multiple sites: Secondary | ICD-10-CM | POA: Diagnosis not present

## 2022-07-19 DIAGNOSIS — Z6826 Body mass index (BMI) 26.0-26.9, adult: Secondary | ICD-10-CM | POA: Diagnosis not present

## 2022-07-19 DIAGNOSIS — Z79899 Other long term (current) drug therapy: Secondary | ICD-10-CM | POA: Diagnosis not present

## 2022-07-19 DIAGNOSIS — M353 Polymyalgia rheumatica: Secondary | ICD-10-CM | POA: Diagnosis not present

## 2022-07-20 DIAGNOSIS — M5441 Lumbago with sciatica, right side: Secondary | ICD-10-CM | POA: Diagnosis not present

## 2022-07-20 DIAGNOSIS — R531 Weakness: Secondary | ICD-10-CM | POA: Diagnosis not present

## 2022-07-20 DIAGNOSIS — R262 Difficulty in walking, not elsewhere classified: Secondary | ICD-10-CM | POA: Diagnosis not present

## 2022-07-20 DIAGNOSIS — M5451 Vertebrogenic low back pain: Secondary | ICD-10-CM | POA: Diagnosis not present

## 2022-07-25 DIAGNOSIS — R262 Difficulty in walking, not elsewhere classified: Secondary | ICD-10-CM | POA: Diagnosis not present

## 2022-07-25 DIAGNOSIS — M5441 Lumbago with sciatica, right side: Secondary | ICD-10-CM | POA: Diagnosis not present

## 2022-07-25 DIAGNOSIS — M5451 Vertebrogenic low back pain: Secondary | ICD-10-CM | POA: Diagnosis not present

## 2022-07-25 DIAGNOSIS — R531 Weakness: Secondary | ICD-10-CM | POA: Diagnosis not present

## 2022-08-01 DIAGNOSIS — M5451 Vertebrogenic low back pain: Secondary | ICD-10-CM | POA: Diagnosis not present

## 2022-08-01 DIAGNOSIS — R262 Difficulty in walking, not elsewhere classified: Secondary | ICD-10-CM | POA: Diagnosis not present

## 2022-08-01 DIAGNOSIS — M5441 Lumbago with sciatica, right side: Secondary | ICD-10-CM | POA: Diagnosis not present

## 2022-08-01 DIAGNOSIS — R531 Weakness: Secondary | ICD-10-CM | POA: Diagnosis not present

## 2022-08-17 DIAGNOSIS — E1169 Type 2 diabetes mellitus with other specified complication: Secondary | ICD-10-CM | POA: Diagnosis not present

## 2022-08-17 DIAGNOSIS — I10 Essential (primary) hypertension: Secondary | ICD-10-CM | POA: Diagnosis not present

## 2022-08-17 DIAGNOSIS — Z794 Long term (current) use of insulin: Secondary | ICD-10-CM | POA: Diagnosis not present

## 2022-08-17 DIAGNOSIS — E785 Hyperlipidemia, unspecified: Secondary | ICD-10-CM | POA: Diagnosis not present

## 2022-10-18 DIAGNOSIS — M0609 Rheumatoid arthritis without rheumatoid factor, multiple sites: Secondary | ICD-10-CM | POA: Diagnosis not present

## 2022-10-18 DIAGNOSIS — M353 Polymyalgia rheumatica: Secondary | ICD-10-CM | POA: Diagnosis not present

## 2022-10-18 DIAGNOSIS — Z79899 Other long term (current) drug therapy: Secondary | ICD-10-CM | POA: Diagnosis not present

## 2022-10-18 DIAGNOSIS — M1991 Primary osteoarthritis, unspecified site: Secondary | ICD-10-CM | POA: Diagnosis not present

## 2022-10-18 DIAGNOSIS — K121 Other forms of stomatitis: Secondary | ICD-10-CM | POA: Diagnosis not present

## 2022-10-18 DIAGNOSIS — Z6825 Body mass index (BMI) 25.0-25.9, adult: Secondary | ICD-10-CM | POA: Diagnosis not present

## 2022-10-18 DIAGNOSIS — B370 Candidal stomatitis: Secondary | ICD-10-CM | POA: Diagnosis not present

## 2022-10-18 DIAGNOSIS — E663 Overweight: Secondary | ICD-10-CM | POA: Diagnosis not present

## 2022-10-31 DIAGNOSIS — Z794 Long term (current) use of insulin: Secondary | ICD-10-CM | POA: Diagnosis not present

## 2022-10-31 DIAGNOSIS — I10 Essential (primary) hypertension: Secondary | ICD-10-CM | POA: Diagnosis not present

## 2022-10-31 DIAGNOSIS — L718 Other rosacea: Secondary | ICD-10-CM | POA: Diagnosis not present

## 2022-10-31 DIAGNOSIS — F329 Major depressive disorder, single episode, unspecified: Secondary | ICD-10-CM | POA: Diagnosis not present

## 2022-10-31 DIAGNOSIS — M069 Rheumatoid arthritis, unspecified: Secondary | ICD-10-CM | POA: Diagnosis not present

## 2022-10-31 DIAGNOSIS — Z85828 Personal history of other malignant neoplasm of skin: Secondary | ICD-10-CM | POA: Diagnosis not present

## 2022-10-31 DIAGNOSIS — M353 Polymyalgia rheumatica: Secondary | ICD-10-CM | POA: Diagnosis not present

## 2022-10-31 DIAGNOSIS — E1169 Type 2 diabetes mellitus with other specified complication: Secondary | ICD-10-CM | POA: Diagnosis not present

## 2022-11-25 DIAGNOSIS — R300 Dysuria: Secondary | ICD-10-CM | POA: Diagnosis not present

## 2022-12-14 DIAGNOSIS — I10 Essential (primary) hypertension: Secondary | ICD-10-CM | POA: Diagnosis not present

## 2022-12-14 DIAGNOSIS — E1169 Type 2 diabetes mellitus with other specified complication: Secondary | ICD-10-CM | POA: Diagnosis not present

## 2022-12-14 DIAGNOSIS — Z794 Long term (current) use of insulin: Secondary | ICD-10-CM | POA: Diagnosis not present

## 2022-12-14 DIAGNOSIS — E785 Hyperlipidemia, unspecified: Secondary | ICD-10-CM | POA: Diagnosis not present

## 2023-01-24 DIAGNOSIS — M0609 Rheumatoid arthritis without rheumatoid factor, multiple sites: Secondary | ICD-10-CM | POA: Diagnosis not present

## 2023-03-30 DIAGNOSIS — E785 Hyperlipidemia, unspecified: Secondary | ICD-10-CM | POA: Diagnosis not present

## 2023-03-30 DIAGNOSIS — E1169 Type 2 diabetes mellitus with other specified complication: Secondary | ICD-10-CM | POA: Diagnosis not present

## 2023-03-30 DIAGNOSIS — I10 Essential (primary) hypertension: Secondary | ICD-10-CM | POA: Diagnosis not present

## 2023-03-30 DIAGNOSIS — Z794 Long term (current) use of insulin: Secondary | ICD-10-CM | POA: Diagnosis not present

## 2023-04-25 DIAGNOSIS — M1991 Primary osteoarthritis, unspecified site: Secondary | ICD-10-CM | POA: Diagnosis not present

## 2023-04-25 DIAGNOSIS — E663 Overweight: Secondary | ICD-10-CM | POA: Diagnosis not present

## 2023-04-25 DIAGNOSIS — M353 Polymyalgia rheumatica: Secondary | ICD-10-CM | POA: Diagnosis not present

## 2023-04-25 DIAGNOSIS — Z6825 Body mass index (BMI) 25.0-25.9, adult: Secondary | ICD-10-CM | POA: Diagnosis not present

## 2023-04-25 DIAGNOSIS — Z79899 Other long term (current) drug therapy: Secondary | ICD-10-CM | POA: Diagnosis not present

## 2023-04-25 DIAGNOSIS — M0609 Rheumatoid arthritis without rheumatoid factor, multiple sites: Secondary | ICD-10-CM | POA: Diagnosis not present

## 2023-05-04 DIAGNOSIS — E113293 Type 2 diabetes mellitus with mild nonproliferative diabetic retinopathy without macular edema, bilateral: Secondary | ICD-10-CM | POA: Diagnosis not present

## 2023-05-04 DIAGNOSIS — H04123 Dry eye syndrome of bilateral lacrimal glands: Secondary | ICD-10-CM | POA: Diagnosis not present

## 2023-05-04 DIAGNOSIS — H35033 Hypertensive retinopathy, bilateral: Secondary | ICD-10-CM | POA: Diagnosis not present

## 2023-05-04 DIAGNOSIS — H5203 Hypermetropia, bilateral: Secondary | ICD-10-CM | POA: Diagnosis not present

## 2023-05-04 DIAGNOSIS — H524 Presbyopia: Secondary | ICD-10-CM | POA: Diagnosis not present

## 2023-05-17 DIAGNOSIS — Z1231 Encounter for screening mammogram for malignant neoplasm of breast: Secondary | ICD-10-CM | POA: Diagnosis not present

## 2023-06-26 DIAGNOSIS — I10 Essential (primary) hypertension: Secondary | ICD-10-CM | POA: Diagnosis not present

## 2023-06-26 DIAGNOSIS — E785 Hyperlipidemia, unspecified: Secondary | ICD-10-CM | POA: Diagnosis not present

## 2023-06-26 DIAGNOSIS — E1169 Type 2 diabetes mellitus with other specified complication: Secondary | ICD-10-CM | POA: Diagnosis not present

## 2023-07-03 ENCOUNTER — Encounter: Payer: Self-pay | Admitting: Gastroenterology

## 2023-07-03 DIAGNOSIS — K2100 Gastro-esophageal reflux disease with esophagitis, without bleeding: Secondary | ICD-10-CM | POA: Diagnosis not present

## 2023-07-03 DIAGNOSIS — I10 Essential (primary) hypertension: Secondary | ICD-10-CM | POA: Diagnosis not present

## 2023-07-03 DIAGNOSIS — E785 Hyperlipidemia, unspecified: Secondary | ICD-10-CM | POA: Diagnosis not present

## 2023-07-03 DIAGNOSIS — M069 Rheumatoid arthritis, unspecified: Secondary | ICD-10-CM | POA: Diagnosis not present

## 2023-07-03 DIAGNOSIS — R82998 Other abnormal findings in urine: Secondary | ICD-10-CM | POA: Diagnosis not present

## 2023-07-03 DIAGNOSIS — Z794 Long term (current) use of insulin: Secondary | ICD-10-CM | POA: Diagnosis not present

## 2023-07-03 DIAGNOSIS — E1169 Type 2 diabetes mellitus with other specified complication: Secondary | ICD-10-CM | POA: Diagnosis not present

## 2023-07-03 DIAGNOSIS — Z1212 Encounter for screening for malignant neoplasm of rectum: Secondary | ICD-10-CM | POA: Diagnosis not present

## 2023-07-03 DIAGNOSIS — M1990 Unspecified osteoarthritis, unspecified site: Secondary | ICD-10-CM | POA: Diagnosis not present

## 2023-07-03 DIAGNOSIS — E663 Overweight: Secondary | ICD-10-CM | POA: Diagnosis not present

## 2023-07-03 DIAGNOSIS — Z1339 Encounter for screening examination for other mental health and behavioral disorders: Secondary | ICD-10-CM | POA: Diagnosis not present

## 2023-07-03 DIAGNOSIS — F329 Major depressive disorder, single episode, unspecified: Secondary | ICD-10-CM | POA: Diagnosis not present

## 2023-07-03 DIAGNOSIS — Z0000 Encounter for general adult medical examination without abnormal findings: Secondary | ICD-10-CM | POA: Diagnosis not present

## 2023-07-03 DIAGNOSIS — Z1331 Encounter for screening for depression: Secondary | ICD-10-CM | POA: Diagnosis not present

## 2023-07-18 DIAGNOSIS — H903 Sensorineural hearing loss, bilateral: Secondary | ICD-10-CM | POA: Diagnosis not present

## 2023-07-25 DIAGNOSIS — M0609 Rheumatoid arthritis without rheumatoid factor, multiple sites: Secondary | ICD-10-CM | POA: Diagnosis not present

## 2023-07-25 DIAGNOSIS — H35033 Hypertensive retinopathy, bilateral: Secondary | ICD-10-CM | POA: Diagnosis not present

## 2023-07-25 DIAGNOSIS — H353131 Nonexudative age-related macular degeneration, bilateral, early dry stage: Secondary | ICD-10-CM | POA: Diagnosis not present

## 2023-07-25 DIAGNOSIS — E119 Type 2 diabetes mellitus without complications: Secondary | ICD-10-CM | POA: Diagnosis not present

## 2023-08-30 DIAGNOSIS — R531 Weakness: Secondary | ICD-10-CM | POA: Diagnosis not present

## 2023-08-30 DIAGNOSIS — Z23 Encounter for immunization: Secondary | ICD-10-CM | POA: Diagnosis not present

## 2023-08-30 DIAGNOSIS — R5383 Other fatigue: Secondary | ICD-10-CM | POA: Diagnosis not present

## 2023-09-05 DIAGNOSIS — D485 Neoplasm of uncertain behavior of skin: Secondary | ICD-10-CM | POA: Diagnosis not present

## 2023-09-05 DIAGNOSIS — Z85828 Personal history of other malignant neoplasm of skin: Secondary | ICD-10-CM | POA: Diagnosis not present

## 2023-09-05 DIAGNOSIS — D225 Melanocytic nevi of trunk: Secondary | ICD-10-CM | POA: Diagnosis not present

## 2023-09-05 DIAGNOSIS — L821 Other seborrheic keratosis: Secondary | ICD-10-CM | POA: Diagnosis not present

## 2023-09-05 DIAGNOSIS — D1721 Benign lipomatous neoplasm of skin and subcutaneous tissue of right arm: Secondary | ICD-10-CM | POA: Diagnosis not present

## 2023-09-05 DIAGNOSIS — L814 Other melanin hyperpigmentation: Secondary | ICD-10-CM | POA: Diagnosis not present

## 2023-09-05 DIAGNOSIS — L820 Inflamed seborrheic keratosis: Secondary | ICD-10-CM | POA: Diagnosis not present

## 2023-09-05 DIAGNOSIS — D1722 Benign lipomatous neoplasm of skin and subcutaneous tissue of left arm: Secondary | ICD-10-CM | POA: Diagnosis not present

## 2023-09-05 DIAGNOSIS — L720 Epidermal cyst: Secondary | ICD-10-CM | POA: Diagnosis not present

## 2023-09-05 DIAGNOSIS — L570 Actinic keratosis: Secondary | ICD-10-CM | POA: Diagnosis not present

## 2023-09-05 DIAGNOSIS — C44712 Basal cell carcinoma of skin of right lower limb, including hip: Secondary | ICD-10-CM | POA: Diagnosis not present

## 2023-09-13 DIAGNOSIS — C44712 Basal cell carcinoma of skin of right lower limb, including hip: Secondary | ICD-10-CM | POA: Diagnosis not present

## 2023-09-19 DIAGNOSIS — H903 Sensorineural hearing loss, bilateral: Secondary | ICD-10-CM | POA: Diagnosis not present

## 2023-09-28 DIAGNOSIS — Z794 Long term (current) use of insulin: Secondary | ICD-10-CM | POA: Diagnosis not present

## 2023-09-28 DIAGNOSIS — E785 Hyperlipidemia, unspecified: Secondary | ICD-10-CM | POA: Diagnosis not present

## 2023-09-28 DIAGNOSIS — I10 Essential (primary) hypertension: Secondary | ICD-10-CM | POA: Diagnosis not present

## 2023-09-28 DIAGNOSIS — E1169 Type 2 diabetes mellitus with other specified complication: Secondary | ICD-10-CM | POA: Diagnosis not present

## 2023-10-24 DIAGNOSIS — E663 Overweight: Secondary | ICD-10-CM | POA: Diagnosis not present

## 2023-10-24 DIAGNOSIS — Z6825 Body mass index (BMI) 25.0-25.9, adult: Secondary | ICD-10-CM | POA: Diagnosis not present

## 2023-10-24 DIAGNOSIS — M353 Polymyalgia rheumatica: Secondary | ICD-10-CM | POA: Diagnosis not present

## 2023-10-24 DIAGNOSIS — M0609 Rheumatoid arthritis without rheumatoid factor, multiple sites: Secondary | ICD-10-CM | POA: Diagnosis not present

## 2023-10-24 DIAGNOSIS — M1991 Primary osteoarthritis, unspecified site: Secondary | ICD-10-CM | POA: Diagnosis not present

## 2023-10-24 DIAGNOSIS — Z79899 Other long term (current) drug therapy: Secondary | ICD-10-CM | POA: Diagnosis not present

## 2023-11-01 DIAGNOSIS — D849 Immunodeficiency, unspecified: Secondary | ICD-10-CM | POA: Diagnosis not present

## 2023-11-01 DIAGNOSIS — I10 Essential (primary) hypertension: Secondary | ICD-10-CM | POA: Diagnosis not present

## 2023-11-01 DIAGNOSIS — M353 Polymyalgia rheumatica: Secondary | ICD-10-CM | POA: Diagnosis not present

## 2023-11-01 DIAGNOSIS — L719 Rosacea, unspecified: Secondary | ICD-10-CM | POA: Diagnosis not present

## 2023-11-01 DIAGNOSIS — F329 Major depressive disorder, single episode, unspecified: Secondary | ICD-10-CM | POA: Diagnosis not present

## 2023-11-01 DIAGNOSIS — E1169 Type 2 diabetes mellitus with other specified complication: Secondary | ICD-10-CM | POA: Diagnosis not present

## 2023-11-01 DIAGNOSIS — L089 Local infection of the skin and subcutaneous tissue, unspecified: Secondary | ICD-10-CM | POA: Diagnosis not present

## 2023-11-01 DIAGNOSIS — Z794 Long term (current) use of insulin: Secondary | ICD-10-CM | POA: Diagnosis not present

## 2023-11-01 DIAGNOSIS — K2100 Gastro-esophageal reflux disease with esophagitis, without bleeding: Secondary | ICD-10-CM | POA: Diagnosis not present

## 2024-01-02 DIAGNOSIS — Z85828 Personal history of other malignant neoplasm of skin: Secondary | ICD-10-CM | POA: Diagnosis not present

## 2024-01-02 DIAGNOSIS — L700 Acne vulgaris: Secondary | ICD-10-CM | POA: Diagnosis not present

## 2024-01-02 DIAGNOSIS — L918 Other hypertrophic disorders of the skin: Secondary | ICD-10-CM | POA: Diagnosis not present

## 2024-01-25 DIAGNOSIS — I10 Essential (primary) hypertension: Secondary | ICD-10-CM | POA: Diagnosis not present

## 2024-01-25 DIAGNOSIS — E785 Hyperlipidemia, unspecified: Secondary | ICD-10-CM | POA: Diagnosis not present

## 2024-01-25 DIAGNOSIS — Z794 Long term (current) use of insulin: Secondary | ICD-10-CM | POA: Diagnosis not present

## 2024-01-25 DIAGNOSIS — E1169 Type 2 diabetes mellitus with other specified complication: Secondary | ICD-10-CM | POA: Diagnosis not present

## 2024-01-25 DIAGNOSIS — M0609 Rheumatoid arthritis without rheumatoid factor, multiple sites: Secondary | ICD-10-CM | POA: Diagnosis not present

## 2024-02-05 DIAGNOSIS — M797 Fibromyalgia: Secondary | ICD-10-CM | POA: Diagnosis not present

## 2024-02-05 DIAGNOSIS — E2839 Other primary ovarian failure: Secondary | ICD-10-CM | POA: Diagnosis not present

## 2024-02-05 DIAGNOSIS — Z7952 Long term (current) use of systemic steroids: Secondary | ICD-10-CM | POA: Diagnosis not present

## 2024-02-05 DIAGNOSIS — M8588 Other specified disorders of bone density and structure, other site: Secondary | ICD-10-CM | POA: Diagnosis not present

## 2024-03-27 DIAGNOSIS — D692 Other nonthrombocytopenic purpura: Secondary | ICD-10-CM | POA: Diagnosis not present

## 2024-03-27 DIAGNOSIS — L821 Other seborrheic keratosis: Secondary | ICD-10-CM | POA: Diagnosis not present

## 2024-03-27 DIAGNOSIS — Z794 Long term (current) use of insulin: Secondary | ICD-10-CM | POA: Diagnosis not present

## 2024-03-27 DIAGNOSIS — K2100 Gastro-esophageal reflux disease with esophagitis, without bleeding: Secondary | ICD-10-CM | POA: Diagnosis not present

## 2024-03-27 DIAGNOSIS — I10 Essential (primary) hypertension: Secondary | ICD-10-CM | POA: Diagnosis not present

## 2024-03-27 DIAGNOSIS — L700 Acne vulgaris: Secondary | ICD-10-CM | POA: Diagnosis not present

## 2024-03-27 DIAGNOSIS — D849 Immunodeficiency, unspecified: Secondary | ICD-10-CM | POA: Diagnosis not present

## 2024-03-27 DIAGNOSIS — E785 Hyperlipidemia, unspecified: Secondary | ICD-10-CM | POA: Diagnosis not present

## 2024-03-27 DIAGNOSIS — Z85828 Personal history of other malignant neoplasm of skin: Secondary | ICD-10-CM | POA: Diagnosis not present

## 2024-03-27 DIAGNOSIS — E1169 Type 2 diabetes mellitus with other specified complication: Secondary | ICD-10-CM | POA: Diagnosis not present

## 2024-04-24 DIAGNOSIS — E1169 Type 2 diabetes mellitus with other specified complication: Secondary | ICD-10-CM | POA: Diagnosis not present

## 2024-04-24 DIAGNOSIS — H938X3 Other specified disorders of ear, bilateral: Secondary | ICD-10-CM | POA: Diagnosis not present

## 2024-04-24 DIAGNOSIS — D849 Immunodeficiency, unspecified: Secondary | ICD-10-CM | POA: Diagnosis not present

## 2024-04-24 DIAGNOSIS — J309 Allergic rhinitis, unspecified: Secondary | ICD-10-CM | POA: Diagnosis not present

## 2024-04-24 DIAGNOSIS — R0981 Nasal congestion: Secondary | ICD-10-CM | POA: Diagnosis not present

## 2024-04-24 DIAGNOSIS — Z1152 Encounter for screening for COVID-19: Secondary | ICD-10-CM | POA: Diagnosis not present

## 2024-04-24 DIAGNOSIS — I10 Essential (primary) hypertension: Secondary | ICD-10-CM | POA: Diagnosis not present

## 2024-04-24 DIAGNOSIS — Z794 Long term (current) use of insulin: Secondary | ICD-10-CM | POA: Diagnosis not present

## 2024-04-24 DIAGNOSIS — R059 Cough, unspecified: Secondary | ICD-10-CM | POA: Diagnosis not present

## 2024-04-24 DIAGNOSIS — J029 Acute pharyngitis, unspecified: Secondary | ICD-10-CM | POA: Diagnosis not present

## 2024-04-24 DIAGNOSIS — H6993 Unspecified Eustachian tube disorder, bilateral: Secondary | ICD-10-CM | POA: Diagnosis not present

## 2024-04-30 DIAGNOSIS — E663 Overweight: Secondary | ICD-10-CM | POA: Diagnosis not present

## 2024-04-30 DIAGNOSIS — M1991 Primary osteoarthritis, unspecified site: Secondary | ICD-10-CM | POA: Diagnosis not present

## 2024-04-30 DIAGNOSIS — M353 Polymyalgia rheumatica: Secondary | ICD-10-CM | POA: Diagnosis not present

## 2024-04-30 DIAGNOSIS — M0609 Rheumatoid arthritis without rheumatoid factor, multiple sites: Secondary | ICD-10-CM | POA: Diagnosis not present

## 2024-04-30 DIAGNOSIS — Z6826 Body mass index (BMI) 26.0-26.9, adult: Secondary | ICD-10-CM | POA: Diagnosis not present

## 2024-04-30 DIAGNOSIS — Z79899 Other long term (current) drug therapy: Secondary | ICD-10-CM | POA: Diagnosis not present

## 2024-05-02 DIAGNOSIS — H35033 Hypertensive retinopathy, bilateral: Secondary | ICD-10-CM | POA: Diagnosis not present

## 2024-05-02 DIAGNOSIS — H04123 Dry eye syndrome of bilateral lacrimal glands: Secondary | ICD-10-CM | POA: Diagnosis not present

## 2024-05-02 DIAGNOSIS — H5203 Hypermetropia, bilateral: Secondary | ICD-10-CM | POA: Diagnosis not present

## 2024-05-02 DIAGNOSIS — D23111 Other benign neoplasm of skin of right upper eyelid, including canthus: Secondary | ICD-10-CM | POA: Diagnosis not present

## 2024-05-02 DIAGNOSIS — H524 Presbyopia: Secondary | ICD-10-CM | POA: Diagnosis not present

## 2024-05-02 DIAGNOSIS — H21553 Recession of chamber angle, bilateral: Secondary | ICD-10-CM | POA: Diagnosis not present

## 2024-05-09 DIAGNOSIS — Z794 Long term (current) use of insulin: Secondary | ICD-10-CM | POA: Diagnosis not present

## 2024-05-09 DIAGNOSIS — Z7189 Other specified counseling: Secondary | ICD-10-CM | POA: Diagnosis not present

## 2024-05-09 DIAGNOSIS — I10 Essential (primary) hypertension: Secondary | ICD-10-CM | POA: Diagnosis not present

## 2024-05-09 DIAGNOSIS — E785 Hyperlipidemia, unspecified: Secondary | ICD-10-CM | POA: Diagnosis not present

## 2024-05-09 DIAGNOSIS — E1169 Type 2 diabetes mellitus with other specified complication: Secondary | ICD-10-CM | POA: Diagnosis not present

## 2024-05-22 DIAGNOSIS — Z1231 Encounter for screening mammogram for malignant neoplasm of breast: Secondary | ICD-10-CM | POA: Diagnosis not present

## 2024-06-04 DIAGNOSIS — B351 Tinea unguium: Secondary | ICD-10-CM | POA: Diagnosis not present

## 2024-06-04 DIAGNOSIS — I10 Essential (primary) hypertension: Secondary | ICD-10-CM | POA: Diagnosis not present

## 2024-06-04 DIAGNOSIS — M353 Polymyalgia rheumatica: Secondary | ICD-10-CM | POA: Diagnosis not present

## 2024-06-04 DIAGNOSIS — T148XXA Other injury of unspecified body region, initial encounter: Secondary | ICD-10-CM | POA: Diagnosis not present

## 2024-06-04 DIAGNOSIS — D849 Immunodeficiency, unspecified: Secondary | ICD-10-CM | POA: Diagnosis not present

## 2024-06-04 DIAGNOSIS — Z794 Long term (current) use of insulin: Secondary | ICD-10-CM | POA: Diagnosis not present

## 2024-06-04 DIAGNOSIS — E1169 Type 2 diabetes mellitus with other specified complication: Secondary | ICD-10-CM | POA: Diagnosis not present

## 2024-06-04 DIAGNOSIS — W540XXA Bitten by dog, initial encounter: Secondary | ICD-10-CM | POA: Diagnosis not present

## 2024-06-28 DIAGNOSIS — M65311 Trigger thumb, right thumb: Secondary | ICD-10-CM | POA: Diagnosis not present

## 2024-06-28 DIAGNOSIS — M79641 Pain in right hand: Secondary | ICD-10-CM | POA: Diagnosis not present

## 2024-07-01 DIAGNOSIS — Z1212 Encounter for screening for malignant neoplasm of rectum: Secondary | ICD-10-CM | POA: Diagnosis not present

## 2024-07-01 DIAGNOSIS — E785 Hyperlipidemia, unspecified: Secondary | ICD-10-CM | POA: Diagnosis not present

## 2024-08-01 NOTE — Progress Notes (Signed)
 Shawna Payne                                          MRN: [id number]   08/01/2024   The [facility] Quality Team Specialist reviewed this patient medical record for the purposes of chart review for care gap closure. The following were reviewed: chart review for care gap closure-kidney health evaluation for diabetes:eGFR  and uACR.    [facility] Quality Team
